# Patient Record
Sex: Female | Born: 1990 | Race: White | Hispanic: No | Smoking: Never smoker
Health system: Southern US, Community
[De-identification: ages and names within clinical notes are randomized; demographics above are authoritative.]

## PROBLEM LIST (undated history)

## (undated) DIAGNOSIS — B86 Scabies: Secondary | ICD-10-CM

## (undated) DIAGNOSIS — Z6841 Body Mass Index (BMI) 40.0 and over, adult: Secondary | ICD-10-CM

## (undated) DIAGNOSIS — B009 Herpesviral infection, unspecified: Secondary | ICD-10-CM

## (undated) DIAGNOSIS — F419 Anxiety disorder, unspecified: Secondary | ICD-10-CM

## (undated) DIAGNOSIS — O99210 Obesity complicating pregnancy, unspecified trimester: Secondary | ICD-10-CM

## (undated) DIAGNOSIS — K219 Gastro-esophageal reflux disease without esophagitis: Secondary | ICD-10-CM

## (undated) HISTORY — DX: Obesity complicating pregnancy, unspecified trimester: O99.210

## (undated) HISTORY — DX: Herpesviral infection, unspecified: B00.9

## (undated) HISTORY — DX: Anxiety disorder, unspecified: F41.9

## (undated) HISTORY — DX: Body Mass Index (BMI) 40.0 and over, adult: Z684

## (undated) HISTORY — PX: NO PAST SURGERIES: SHX2092

---
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
## 2014-08-05 ENCOUNTER — Encounter: Payer: Self-pay | Admitting: Emergency Medicine

## 2014-08-05 ENCOUNTER — Ambulatory Visit
Admission: EM | Admit: 2014-08-05 | Discharge: 2014-08-05 | Disposition: A | Discharge status: Home / Self Care | Payer: Self-pay | Attending: Internal Medicine | Admitting: Internal Medicine

## 2014-08-05 DIAGNOSIS — K529 Noninfective gastroenteritis and colitis, unspecified: Secondary | ICD-10-CM | POA: Insufficient documentation

## 2014-08-05 DIAGNOSIS — K219 Gastro-esophageal reflux disease without esophagitis: Secondary | ICD-10-CM | POA: Insufficient documentation

## 2014-08-05 HISTORY — DX: Gastro-esophageal reflux disease without esophagitis: K21.9

## 2014-08-05 LAB — URINALYSIS COMPLETE WITH MICROSCOPIC (ARMC ONLY)
Glucose, UA: NEGATIVE mg/dL
Hgb urine dipstick: NEGATIVE
LEUKOCYTES UA: NEGATIVE
Nitrite: NEGATIVE
PH: 5.5 (ref 5.0–8.0)
PROTEIN: NEGATIVE mg/dL
RBC / HPF: NONE SEEN RBC/hpf (ref <3)
Specific Gravity, Urine: 1.03 (ref 1.005–1.030)

## 2014-08-05 LAB — CBC WITH DIFFERENTIAL/PLATELET
BASOS ABS: 0.1 10*3/uL (ref 0–0.1)
BASOS PCT: 1 %
Eosinophils Absolute: 0.1 10*3/uL (ref 0–0.7)
Eosinophils Relative: 1 %
HEMATOCRIT: 40.7 % (ref 35.0–47.0)
Hemoglobin: 13.8 g/dL (ref 12.0–16.0)
Lymphocytes Relative: 15 %
Lymphs Abs: 1.5 10*3/uL (ref 1.0–3.6)
MCH: 29.9 pg (ref 26.0–34.0)
MCHC: 33.9 g/dL (ref 32.0–36.0)
MCV: 88.3 fL (ref 80.0–100.0)
MONO ABS: 0.6 10*3/uL (ref 0.2–0.9)
Monocytes Relative: 6 %
Neutro Abs: 7.8 10*3/uL — ABNORMAL HIGH (ref 1.4–6.5)
Neutrophils Relative %: 77 %
PLATELETS: 237 10*3/uL (ref 150–440)
RBC: 4.61 MIL/uL (ref 3.80–5.20)
RDW: 12.6 % (ref 11.5–14.5)
WBC: 10.1 10*3/uL (ref 3.6–11.0)

## 2014-08-05 LAB — COMPREHENSIVE METABOLIC PANEL
ALK PHOS: 75 U/L (ref 38–126)
ALT: 24 U/L (ref 14–54)
AST: 24 U/L (ref 15–41)
Albumin: 4.4 g/dL (ref 3.5–5.0)
Anion gap: 6 (ref 5–15)
BUN: 10 mg/dL (ref 6–20)
CHLORIDE: 103 mmol/L (ref 101–111)
CO2: 31 mmol/L (ref 22–32)
CREATININE: 0.91 mg/dL (ref 0.44–1.00)
Calcium: 10 mg/dL (ref 8.9–10.3)
GFR calc Af Amer: >60 mL/min (ref >60)
Glucose, Bld: 102 mg/dL — ABNORMAL HIGH (ref 65–99)
Potassium: 4 mmol/L (ref 3.5–5.1)
Sodium: 140 mmol/L (ref 135–145)
Total Bilirubin: 0.6 mg/dL (ref 0.3–1.2)
Total Protein: 7.9 g/dL (ref 6.5–8.1)

## 2014-08-05 LAB — PREGNANCY, URINE: PREG TEST UR: NEGATIVE

## 2014-08-05 LAB — RAPID STREP SCREEN (MED CTR MEBANE ONLY): Streptococcus, Group A Screen (Direct): NEGATIVE

## 2014-08-05 MED ORDER — ONDANSETRON 8 MG PO TBDP
8.0000 mg | ORAL_TABLET | Freq: Once | ORAL | Status: AC
  Administered 2014-08-05: 8 mg via ORAL

## 2014-08-05 MED ORDER — ESOMEPRAZOLE MAGNESIUM 40 MG PO CPDR: 40.0000 mg | DELAYED_RELEASE_CAPSULE | Freq: Every day | ORAL | Status: DC

## 2014-08-05 MED ORDER — ONDANSETRON HCL 8 MG PO TABS: 8.0000 mg | ORAL_TABLET | Freq: Two times a day (BID) | ORAL | Status: DC

## 2014-08-05 NOTE — ED Notes (Addendum)
Nausea and vomiting on and off for 2 months. Also c/o episodes of diarrhea with this. Worsening over the last three days. Keeping water down, but unable to keep pepto or tums down. Tastes like "acid" when she is vomiting. Is taking birth control pills. Takes it regularly. Is having chills. No fevers or body aches. Having left lower abdominal pain.

## 2014-08-05 NOTE — ED Provider Notes (Signed)
CSN: [id number]     Arrival date & time 08/05/14  0755 History   First MD Initiated Contact with Patient 08/05/14 [phone]     Chief Complaint  Patient presents with  . Nausea  . 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  Chills latest episode started 10 Jun and peptobismol tabs not working this time vomiting those up woke up 0500 with vomiting and belly pain this am.  4 bouts ememsis since then first visit to provider.  Problems started with diarrhea and vomiting 3 weeks ago was told at work others with similar symptoms thought she had virus.  Symptoms resolved but then constipation mucous stools second week.  Diarrhea has reoccurred 3-4 times but never vomiting this bad.  Pain currently 5-6/10 worst 9-10/10 3 weeks ago best 0/10 9 Jun.  Headache started 10 Jun dull.  Dizzy with emesis.  Feels like acid water coming up throat.  Noticed left eye rash has been apply cortisone cream and it has been improving used to be half dollar size now dime sized.  Patient is a 24 y.o. female presenting with vomiting. The history is provided by the patient.  Emesis Severity:  Moderate Duration:  4 days Timing:  Intermittent Number of daily episodes:  4 Quality:  Bilious material Able to tolerate:  Liquids Progression:  Worsening Chronicity:  Recurrent Recent urination:  Decreased Context: not post-tussive and not self-induced   Relieved by:  Nothing Worsened by:  Liquids Ineffective treatments:  Liquids Associated symptoms: abdominal pain, chills, diarrhea and headaches   Associated symptoms: no arthralgias, no cough, no fever, no myalgias, no sore throat and no URI   Abdominal pain:    Location:  LLQ and periumbilical   Quality:  Aching   Severity:  Moderate   Onset  quality:  Gradual   Duration:  3 weeks   Timing:  Intermittent   Progression:  Waxing and waning   Chronicity:  Recurrent Diarrhea:    Quality:  Mucous   Severity:  Moderate   Duration:  3 weeks   Timing:  Intermittent Headaches:    Severity:  Mild   Onset quality:  Sudden   Duration:  3 days   Timing:  Constant   Progression:  Waxing and waning   Chronicity:  New Risk factors: no alcohol use, no diabetes, not pregnant now, no prior abdominal surgery, no sick contacts, no suspect food intake and no travel to endemic areas     Past Medical History  Diagnosis Date  . GERD (gastroesophageal reflux disease)    History reviewed. No pertinent past surgical history. History reviewed. No pertinent family history. History  Substance Use Topics  . Smoking status: Never Smoker   . Smokeless tobacco: Never Used  . Alcohol Use: No   OB History    No data available     Review of Systems  Constitutional: Positive for chills. Negative for fever, diaphoresis, activity change, appetite change and fatigue.  HENT: Negative for congestion, ear discharge, ear pain, facial swelling, postnasal drip, rhinorrhea, sinus pressure, sneezing, sore throat, tinnitus, trouble swallowing and voice change.   Eyes: Negative for photophobia, pain, discharge, redness, itching and visual disturbance.  Respiratory: Negative for cough, choking, chest tightness, shortness of breath, wheezing and stridor.   Cardiovascular: Negative for chest pain, palpitations and leg swelling.  Gastrointestinal: Positive for nausea, vomiting, abdominal pain, diarrhea  and constipation. Negative for blood in stool, abdominal distention, anal bleeding and rectal pain.  Endocrine: Negative for cold intolerance and heat intolerance.  Genitourinary: Negative for dysuria, frequency, flank pain, enuresis and difficulty urinating.  Musculoskeletal: Negative for myalgias, back pain, joint swelling, arthralgias, gait problem, neck pain and  neck stiffness.  Skin: Negative for color change, pallor, rash and wound.  Allergic/Immunologic: Negative for environmental allergies and food allergies.  Neurological: Positive for dizziness and headaches. Negative for tremors, seizures, syncope, facial asymmetry, speech difficulty, weakness, light-headedness and numbness.  Hematological: Negative for adenopathy. Does not bruise/bleed easily.  Psychiatric/Behavioral: Positive for sleep disturbance. Negative for behavioral problems, confusion and agitation.    Allergies  Aluminum-containing compounds  Home Medications   Prior to Admission medications   Medication Sig Start Date End Date Taking? Authorizing Provider  norethindrone-ethinyl estradiol-iron (JUNEL FE 1.5/30) 1.5-30 MG-MCG tablet Take 1 tablet by mouth daily.   Yes Historical Provider, MD  ranitidine (ZANTAC) 150 MG tablet Take 150 mg by mouth daily.   Yes Historical Provider, MD  esomeprazole (NEXIUM) 40 MG capsule Take 1 capsule (40 mg total) by mouth daily. 08/05/14   Barbaraann Barthel, NP  ondansetron (ZOFRAN) 8 MG tablet Take 1 tablet (8 mg total) by mouth 2 (two) times daily. 08/05/14   Jarold Song Betancourt, NP   BP 122/75 mmHg  Pulse 68  Temp(Src) 98.1 F (36.7 C) (Oral)  Resp 16  Ht  (1.651 m)  Wt 208 lb (94.348 kg)  BMI 34.61 kg/m2  SpO2 98%  LMP 07/30/2014 (Exact Date) Physical Exam  Constitutional: She is oriented to person, place, and time. Vital signs are normal. She appears well-developed and well-nourished. No distress.  HENT:  Head: Normocephalic and atraumatic.  Right Ear: External ear normal.  Left Ear: External ear normal.  Nose: Nose normal.  Mouth/Throat: Oropharynx is clear and moist. No oropharyngeal exudate.  Air fluid level bilateral TMs clear; cobblestoning posterior pharynx; tonsils edema/erythema bilaterally 2+/4  Eyes: Conjunctivae, EOM and lids are normal. Pupils are equal, round, and reactive to light. Right eye exhibits no discharge.  Left eye exhibits no discharge. No scleral icterus.  Neck: Trachea normal and normal range of motion. Neck supple. No tracheal deviation present. No thyromegaly present.  Cardiovascular: Normal rate, regular rhythm, normal heart sounds and intact distal pulses.  Exam reveals no gallop and no friction rub.   No murmur heard. Pulmonary/Chest: Effort normal and breath sounds normal. No respiratory distress. She has no wheezes. She has no rales. She exhibits no tenderness.  Abdominal: Soft. Bowel sounds are normal. She exhibits no distension and no mass. There is tenderness. There is no rebound and no guarding.  Musculoskeletal: Normal range of motion. She exhibits no edema or tenderness.  Lymphadenopathy:    She has no cervical adenopathy.  Neurological: She is alert and oriented to person, place, and time. Coordination normal.  Skin: Skin is warm, dry and intact. Rash noted. She is not diaphoretic. No erythema. No pallor.     Psychiatric: She has a normal mood and affect. Her speech is normal and behavior is normal. Judgment and thought content normal. Cognition and memory are normal.  Nursing note and vitals reviewed.   ED Course  Procedures (including critical care time) Labs Review Labs Reviewed  URINALYSIS COMPLETEWITH MICROSCOPIC ([facility] ONLY) - Abnormal; Notable for the following:    APPearance HAZY (*)    Bilirubin Urine 1+ (*)    Ketones, ur TRACE (*)    Bacteria,  UA FEW (*)    Squamous Epithelial / LPF TOO NUMEROUS TO COUNT (*)    All other components within normal limits  COMPREHENSIVE METABOLIC PANEL - Abnormal; Notable for the following:    Glucose, Bld 102 (*)    All other components within normal limits  CBC WITH DIFFERENTIAL/PLATELET - Abnormal; Notable for the following:    Neutro Abs 7.8 (*)    All other components within normal limits  RAPID STREP SCREEN (NOT AT [facility])  CULTURE, GROUP A STREP ([facility] ONLY)  URINE CULTURE  PREGNANCY, URINE   Patient notified  urinalysis, HCG, CBC, CMP results.  Urine culture and throat culture pending over next 48 hours will contact her once results available.  Urinalysis did not appear to be UTI at this time. Nausea resolved with zofran and abdomen pain slightly improved.  Would have tried GI cocktail but patient allergic to maalox.   Patient verbalized understanding of information/instructions, agreed with plan of care and had no further questions at this time.  Imaging Review No results found.   MDM   1. Gastroenteritis    Patient given work excuse for 48 hours.  I have recommended clear fluids and the BRAT diet.  Medications as directed may repeat zofran 8mg  po in 8 hours then BID prn thereafter starting tomorrow. nexium 40mg  po daily x 2 weeks. Patient given supplies to collect stool culture if able to have BM at home.  Tried here but was unable.  Return to the clinic if  symptoms persist or worsen; I have alerted the patient to call if high fever, unable to urinate every 8 hours, dehydration, marked weakness, fainting, increased abdominal pain, blood in stool or vomit.  Patient verbalized agreement and understanding of treatment plan.   P2:  Hand washing and fitness    Barbaraann Barthel, NP 08/05/14 1150

## 2014-08-05 NOTE — Discharge Instructions (Signed)

## 2014-08-07 LAB — CULTURE, GROUP A STREP (THRC)

## 2014-08-08 LAB — URINE CULTURE
Culture: 1000
Special Requests: NORMAL

## 2014-10-26 ENCOUNTER — Ambulatory Visit
Admission: EM | Admit: 2014-10-26 | Discharge: 2014-10-26 | Disposition: A | Discharge status: Home / Self Care | Payer: Self-pay | Attending: Internal Medicine | Admitting: Internal Medicine

## 2014-10-26 DIAGNOSIS — B86 Scabies: Secondary | ICD-10-CM

## 2014-10-26 MED ORDER — PREDNISONE 20 MG PO TABS: 20.0000 mg | ORAL_TABLET | Freq: Every day | ORAL | Status: DC

## 2014-10-26 MED ORDER — IVERMECTIN 3 MG PO TABS: 200.0000 ug/kg | ORAL_TABLET | Freq: Once | ORAL | Status: DC

## 2014-10-26 NOTE — ED Provider Notes (Signed)
CSN: [id number]     Arrival date & time 10/26/14  1243 History   First MD Initiated Contact with Patient 10/26/14 1303     Chief Complaint  Patient presents with  . Allergic Reaction  . Rash   HPI Patient is a 24 year old lady since today with a two-week history of severe itching rash that started in the flexor surface of the right elbow, affects the right arm extensively, and is now appearing in the waistline, the groins, and on her back. The right distal forearm is excoriated and scabbed from scratching; the patient says her boyfriend wakes her up at night sometimes to stop her from scratching. No fever, rash is not sore. Patient is a Theatre stage manager.  Past Medical History  Diagnosis Date  . GERD (gastroesophageal reflux disease)    History reviewed. No pertinent past surgical history. History reviewed. No pertinent family history. Social History  Substance Use Topics  . Smoking status: Never Smoker   . Smokeless tobacco: Never Used  . Alcohol Use: No    Review of Systems  All other systems reviewed and are negative.   Allergies  Aluminum-containing compounds  Home Medications   Prior to Admission medications   Medication Sig Start Date End Date Taking? Authorizing Provider         norethindrone-ethinyl estradiol-iron (JUNEL FE 1.5/30) 1.5-30 MG-MCG tablet Take 1 tablet by mouth daily.    Historical Provider, MD          Meds Ordered and Administered this Visit    BP 120/68 mmHg  Pulse 86  Temp(Src) 98 F (36.7 C) (Oral)  Resp 20  Ht  (1.651 m)  Wt 207 lb (93.895 kg)  BMI 34.45 kg/m2  SpO2 98%  LMP 10/23/2014 No data found.   Physical Exam  Constitutional: She is oriented to person, place, and time. No distress.  Alert, nicely groomed  HENT:  Head: Atraumatic.  Eyes:  Conjugate gaze, no eye redness/drainage  Neck: Neck supple.  Cardiovascular: Normal rate.   Pulmonary/Chest: No respiratory distress.  Abdominal: She exhibits no distension.   Musculoskeletal: Normal range of motion.  No leg swelling  Neurological: She is alert and oriented to person, place, and time.  Skin: Skin is warm and dry.  Pink. No cyanosis Large pink/excoriated patches on the distal right forearm, and in the right elbow flexor surface. Similar, but less excoriated, patches along the waistline, and on the anterior abdomen in the mid back.  Nursing note and vitals reviewed.   ED Course  Procedures    MDM   1. Scabies    New Prescriptions   IVERMECTIN (STROMECTOL) 3 MG TABS TABLET    Take 4 tablets (12,000 mcg total) by mouth once.   PREDNISONE (DELTASONE) 20 MG TABLET    Take 1 tablet (20 mg total) by mouth daily. 3 tabs qd x 3d then 2 tabs qd x 3d then 1 tab qd x 3d then 0.5 tab qd x 4d then stop.   Anticipate gradual improvement in rash and itching over the next 2-4 weeks. Close household contacts should be treated as well. Environmental measures discussed.    Eustace Moore, MD 10/26/14 1329

## 2014-10-26 NOTE — Discharge Instructions (Signed)
Prescriptions for ivermectin (for mites) and prednisone (steroid, for itching) were sent to the [facility].  Anticipate gradual improvement in itching over the next 2-4 weeks. Recheck or followup primary care provider or dermatologist if not improving as expected.  Scabies Scabies are small bugs (mites) that burrow under the skin and cause red bumps and severe itching. These bugs can only be seen with a microscope. Scabies are highly contagious. They can spread easily from person to person by direct contact. They are also spread through sharing clothing or linens that have the scabies mites living in them. It is not unusual for an entire family to become infected through shared towels, clothing, or bedding.  HOME CARE INSTRUCTIONS   Your caregiver may prescribe a cream or lotion to kill the mites. If cream is prescribed, massage the cream into the entire body from the neck to the bottom of both feet. Also massage the cream into the scalp and face if your child is less than 49 year old. Avoid the eyes and mouth. Do not wash your hands after application.  Leave the cream on for 8 to 12 hours. Your child should bathe or shower after the 8 to 12 hour application period. Sometimes it is helpful to apply the cream to your child right before bedtime.  One treatment is usually effective and will eliminate approximately 95% of infestations. For severe cases, your caregiver may decide to repeat the treatment in 1 week. Everyone in your household should be treated with one application of the cream.  New rashes or burrows should not appear within 24 to 48 hours after successful treatment. However, the itching and rash may last for 2 to 4 weeks after successful treatment. Your caregiver may prescribe a medicine to help with the itching or to help the rash go away more quickly.  Scabies can live on clothing or linens for up to 3 days. All of your child's recently used clothing, towels, stuffed toys, and bed linens  should be washed in hot water and then dried in a dryer for at least 20 minutes on high heat. Items that cannot be washed should be enclosed in a plastic bag for at least 3 days.  To help relieve itching, bathe your child in a cool bath or apply cool washcloths to the affected areas.  Your child may return to school after treatment with the prescribed cream. SEEK MEDICAL CARE IF:   The itching persists longer than 4 weeks after treatment.  The rash spreads or becomes infected. Signs of infection include red blisters or yellow-tan crust. Document Released: 02/08/2005 Document Revised: 05/03/2011 Document Reviewed: 06/19/2008 [facility] Patient Information 2015 [place], [place]. This information is not intended to replace advice given to you by your health care provider. Make sure you discuss any questions you have with your health care provider.

## 2014-10-26 NOTE — ED Notes (Signed)
Pt with complaint of possible allergic reaction/rash that STARTED 2 WEEKS AGO. Itching a lot.

## 2014-11-19 ENCOUNTER — Ambulatory Visit
Admission: EM | Admit: 2014-11-19 | Discharge: 2014-11-19 | Disposition: A | Discharge status: Home / Self Care | Payer: Self-pay | Attending: Family Medicine | Admitting: Family Medicine

## 2014-11-19 DIAGNOSIS — B86 Scabies: Secondary | ICD-10-CM

## 2014-11-19 HISTORY — DX: Scabies: B86

## 2014-11-19 MED ORDER — PERMETHRIN 5 % EX CREA: TOPICAL_CREAM | CUTANEOUS | Status: DC

## 2014-11-19 MED ORDER — PREDNISONE 20 MG PO TABS
20.0000 mg | ORAL_TABLET | Freq: Every day | ORAL | Status: DC
Start: 2014-11-19 — End: 2019-05-20

## 2014-11-19 NOTE — ED Provider Notes (Signed)
CSN: [id number]     Arrival date & time 11/19/14  0827 History   First MD Initiated Contact with Patient 11/19/14 [phone]     Chief Complaint  Patient presents with  . Rash   (Consider location/radiation/quality/duration/timing/severity/associated sxs/prior Treatment) HPI Comments: 24 yo female with a h/o scabies, seen and treated 3 weeks ago for scabies, states her symptoms disappeared after treatment, but started recurring again 3 days ago. Similar rash and itching symptoms. States she washed all her clothes and bedding with hot water, but that her boyfriend is itching (without rash) and has not been seen or treated.  Patient is a 24 y.o. female presenting with rash. The history is provided by the patient.  Rash   Past Medical History  Diagnosis Date  . GERD (gastroesophageal reflux disease)   . Scabies    History reviewed. No pertinent past surgical history. History reviewed. No pertinent family history. Social History  Substance Use Topics  . Smoking status: Never Smoker   . Smokeless tobacco: Never Used  . Alcohol Use: No   OB History    No data available     Review of Systems  Skin: Positive for rash.    Allergies  Aluminum-containing compounds  Home Medications   Prior to Admission medications   Medication Sig Start Date End Date Taking? Authorizing Provider  cetirizine (ZYRTEC) 10 MG tablet Take 10 mg by mouth daily.   Yes Historical Provider, MD  norethindrone-ethinyl estradiol-iron (JUNEL FE 1.5/30) 1.5-30 MG-MCG tablet Take 1 tablet by mouth daily.   Yes Historical Provider, MD  ivermectin (STROMECTOL) 3 MG TABS tablet Take 4 tablets (12,000 mcg total) by mouth once. 10/26/14   Eustace Moore, MD  permethrin (ELIMITE) 5 % cream Apply to affected area once 11/19/14   Payton Mccallum, MD  predniSONE (DELTASONE) 20 MG tablet Take 1 tablet (20 mg total) by mouth daily. 11/19/14   Payton Mccallum, MD   Meds Ordered and Administered this Visit  Medications - No data to  display  BP 107/59 mmHg  Pulse 80  Temp(Src) 97.6 F (36.4 C) (Tympanic)  Resp 16  Ht  (1.651 m)  Wt 206 lb (93.441 kg)  BMI 34.28 kg/m2  SpO2 100%  LMP 10/23/2014 (Exact Date) No data found.   Physical Exam  Constitutional: She appears well-developed and well-nourished. No distress.  Pulmonary/Chest: Effort normal. No respiratory distress.  Skin: Rash noted. She is not diaphoretic.  Few erythematous papules, burrows and excoriations on flexural areas of forearms and few on lower back skin  Nursing note and vitals reviewed.   ED Course  Procedures (including critical care time)  Labs Review Labs Reviewed - No data to display  Imaging Review No results found.   Visual Acuity Review  Right Eye Distance:   Left Eye Distance:   Bilateral Distance:    Right Eye Near:   Left Eye Near:    Bilateral Near:         MDM   1. Scabies    New Prescriptions   PERMETHRIN (ELIMITE) 5 % CREAM    Apply to affected area once   PREDNISONE (DELTASONE) 20 MG TABLET    Take 1 tablet (20 mg total) by mouth daily.  Plan: 1. Test/x-ray results and diagnosis reviewed with patient 2. rx as per orders; risks, benefits, potential side effects reviewed with patient 3. Recommend supportive treatment with otc antihistamines prn for itching 4. F/u prn if symptoms worsen or don't improve  Payton Mccallum, MD 11/19/14 [phone]

## 2014-11-19 NOTE — ED Notes (Signed)
States seen here at [facility] the end of August and Dx with Scabies. Rash 'went away for 1 1/2 weeks' and now is back. Rash bilateral forearms, right hip, and on back. Itchy at night and when takes a hot shower

## 2015-01-08 ENCOUNTER — Ambulatory Visit
Admission: EM | Admit: 2015-01-08 | Discharge: 2015-01-08 | Disposition: A | Discharge status: Home / Self Care | Payer: Self-pay | Attending: Family Medicine | Admitting: Family Medicine

## 2015-01-08 ENCOUNTER — Encounter: Payer: Self-pay | Admitting: Emergency Medicine

## 2015-01-08 DIAGNOSIS — J069 Acute upper respiratory infection, unspecified: Secondary | ICD-10-CM

## 2015-01-08 MED ORDER — FLUTICASONE PROPIONATE 50 MCG/ACT NA SUSP: 2.0000 | Freq: Every day | NASAL | Status: DC

## 2015-01-08 MED ORDER — AZITHROMYCIN 250 MG PO TABS: ORAL_TABLET | ORAL | Status: DC

## 2015-01-08 MED ORDER — HYDROCOD POLST-CPM POLST ER 10-8 MG/5ML PO SUER: 5.0000 mL | Freq: Every evening | ORAL | Status: DC | PRN

## 2015-01-08 NOTE — ED Notes (Signed)
Patient states she has been congested since Sunday, last night ears and throat hurt and she noticed some bloody sinus drainage and coughed up some blood this morning.

## 2015-01-08 NOTE — ED Provider Notes (Signed)
Patient presents today with symptoms of nasal congestion with productive cough. Patient states that she's had the symptoms for the last 4-5 days. She also has had some bilateral ear pain with some streaky blood with mucus from her chest. She denies any chest pain or shortness breath. She denies any high fevers or night sweats. She has been taking over-the-counter cough medicine with little relief. She denies any history of COPD or asthma. She is a nonsmoker.  ROS: Negative except mentioned above. Vitals as per Epic.  GENERAL: NAD HEENT: no pharyngeal erythema, no exudate, no erythema of TMs, no cervical LAD RESP: CTA B CARD: RRR NEURO: CN II-XII grossly intact   A/P: URI- Patient given prescription for Z-Pak, Tussionex when necessary daily at bedtime, Flonase when necessary, rest, hydration, work excuse for today given, seek medical attention if symptoms persist or worsen as discussed.  Jolene ProvostKirtida Marea Reasner, MD 01/08/15 [phone]

## 2015-11-19 ENCOUNTER — Ambulatory Visit
Admission: EM | Admit: 2015-11-19 | Discharge: 2015-11-19 | Disposition: A | Discharge status: Home / Self Care | Payer: Self-pay | Attending: Family Medicine | Admitting: Family Medicine

## 2015-11-19 ENCOUNTER — Encounter: Payer: Self-pay | Admitting: *Deleted

## 2015-11-19 DIAGNOSIS — J029 Acute pharyngitis, unspecified: Secondary | ICD-10-CM

## 2015-11-19 LAB — CBC WITH DIFFERENTIAL/PLATELET
Basophils Absolute: 0.1 10*3/uL (ref 0–0.1)
Basophils Relative: 0 %
Eosinophils Absolute: 0.2 10*3/uL (ref 0–0.7)
Eosinophils Relative: 2 %
HEMATOCRIT: 39.6 % (ref 35.0–47.0)
HEMOGLOBIN: 13.4 g/dL (ref 12.0–16.0)
LYMPHS ABS: 1.9 10*3/uL (ref 1.0–3.6)
LYMPHS PCT: 15 %
MCH: 29.6 pg (ref 26.0–34.0)
MCHC: 33.8 g/dL (ref 32.0–36.0)
MCV: 87.5 fL (ref 80.0–100.0)
Monocytes Absolute: 1 10*3/uL — ABNORMAL HIGH (ref 0.2–0.9)
Monocytes Relative: 8 %
NEUTROS ABS: 9.5 10*3/uL — AB (ref 1.4–6.5)
NEUTROS PCT: 75 %
Platelets: 249 10*3/uL (ref 150–440)
RBC: 4.53 MIL/uL (ref 3.80–5.20)
RDW: 12.4 % (ref 11.5–14.5)
WBC: 12.7 10*3/uL — AB (ref 3.6–11.0)

## 2015-11-19 LAB — RAPID STREP SCREEN (MED CTR MEBANE ONLY): Streptococcus, Group A Screen (Direct): NEGATIVE

## 2015-11-19 LAB — MONONUCLEOSIS SCREEN: Mono Screen: NEGATIVE

## 2015-11-19 MED ORDER — AZITHROMYCIN 250 MG PO TABS: ORAL_TABLET | ORAL | 0 refills | Status: DC

## 2015-11-19 NOTE — ED Provider Notes (Addendum)
[facility]    CSN: [id number] Arrival date & time: 11/19/15  1653     History   Chief Complaint Chief Complaint  Patient presents with  . Sore Throat  . Headache    HPI TRENESE HAFT is a 25 y.o. female.   HPI: Patient presents today with symptoms of sore throat, cough, and headache. Patient states that last week she had symptoms of sore throat, nasal congestion, chills. She also has fatigue now. She denies any abdominal symptoms such as nausea, vomiting, diarrhea. She has no abdominal pain, chest pain, shortness of breath. She started her menstrual cycle today.  Past Medical History:  Diagnosis Date  . GERD (gastroesophageal reflux disease)   . Scabies     There are no active problems to display for this patient.   History reviewed. No pertinent surgical history.  OB History    No data available       Home Medications    Prior to Admission medications   Medication Sig Start Date End Date Taking? Authorizing Provider  cetirizine (ZYRTEC) 10 MG tablet Take 10 mg by mouth daily.   Yes Historical Provider, MD  norethindrone-ethinyl estradiol-iron (JUNEL FE 1.5/30) 1.5-30 MG-MCG tablet Take 1 tablet by mouth daily.   Yes Historical Provider, MD  azithromycin (ZITHROMAX Z-PAK) 250 MG tablet Use as directed for 5 days. 01/08/15   Jolene Provost, MD  chlorpheniramine-HYDROcodone (TUSSIONEX PENNKINETIC ER) 10-8 MG/5ML SUER Take 5 mLs by mouth at bedtime as needed for cough. 01/08/15   Jolene Provost, MD  fluticasone (FLONASE) 50 MCG/ACT nasal spray Place 2 sprays into both nostrils daily. As needed for nasal congestion 01/08/15   Jolene Provost, MD  ivermectin (STROMECTOL) 3 MG TABS tablet Take 4 tablets (12,000 mcg total) by mouth once. 10/26/14   Eustace Moore, MD  permethrin (ELIMITE) 5 % cream Apply to affected area once 11/19/14   Payton Mccallum, MD  predniSONE (DELTASONE) 20 MG tablet Take 1 tablet (20 mg total) by mouth daily. 11/19/14   Payton Mccallum, MD     Family History History reviewed. No pertinent family history.  Social History Social History  Substance Use Topics  . Smoking status: Never Smoker  . Smokeless tobacco: Never Used  . Alcohol use Yes     Allergies   Aluminum-containing compounds   Review of Systems Review of Systems: Negative except mentioned above.   Physical Exam Triage Vital Signs ED Triage Vitals  Enc Vitals Group     BP 11/19/15 1726 123/85     Pulse Rate 11/19/15 1726 84     Resp 11/19/15 1726 16     Temp 11/19/15 1726 99.3 F (37.4 C)     Temp Source 11/19/15 1726 Oral     SpO2 11/19/15 1726 98 %     Weight 11/19/15 1728 240 lb (108.9 kg)     Height 11/19/15 1728 5\' 5"  (1.651 m)     Head Circumference --      Peak Flow --      Pain Score --      Pain Loc --      Pain Edu? --      Excl. in GC? --    No data found.   Updated Vital Signs BP 123/85 (BP Location: Left Arm)   Pulse 84   Temp 99.3 F (37.4 C) (Oral)   Resp 16   Ht 5\' 5"  (1.651 m)   Wt 240 lb (108.9 kg)   LMP 11/19/2015 (Exact  Date)   SpO2 98%   BMI 39.94 kg/m      Physical Exam:  GENERAL: NAD HEENT: mild pharyngeal erythema, no exudate, enlarged tonsils, no erythema of TMs, mild cervical LAD RESP: CTA B CARD: RRR ABD: +BS, NT, no organomegly appreciated  NEURO: CN II-XII grosslly intact    UC Treatments / Results  Labs (all labs ordered are listed, but only abnormal results are displayed) Labs Reviewed  RAPID STREP SCREEN (NOT AT [facility])  CULTURE, GROUP A STREP ([facility])  CBC WITH DIFFERENTIAL/PLATELET  MONONUCLEOSIS SCREEN    EKG  EKG Interpretation None       Radiology No results found.  Procedures Procedures (including critical care time)  Medications Ordered in UC Medications - No data to display   Initial Impression / Assessment and Plan / UC Course  I have reviewed the triage vital signs and the nursing notes.  Pertinent labs & imaging results that were available during my care of  the patient were reviewed by me and considered in my medical decision making (see chart for details).  Clinical Course   A/P: Pharyngitis, URI - will treat with Z-Pak, Tylenol/Motrin when necessary, Delsym when necessary, oral antihistamine when necessary, rest, hydration, seek medical attention if symptoms persist or worsen.  Final Clinical Impressions(s) / UC Diagnoses   Final diagnoses:  None    New Prescriptions New Prescriptions   No medications on file     Jolene ProvostKirtida Kischa Altice, MD 11/19/15 1900    Jolene ProvostKirtida Janah Mcculloh, MD 11/19/15 1901

## 2015-11-19 NOTE — ED Triage Notes (Signed)
Pt had cold type symptoms last week but improved until yesterday. Awoke with sore throat yesterday and is worse today along with a headache and productive cough- yellow. States sputum was bloody this morning.

## 2015-11-22 LAB — CULTURE, GROUP A STREP (THRC)

## 2015-11-23 ENCOUNTER — Telehealth: Payer: Self-pay

## 2015-11-23 NOTE — Telephone Encounter (Signed)
Courtesy call back completed today after patient's visit at [facility]. Patient improved and will call back with any questions or concerns.  

## 2017-02-07 DIAGNOSIS — F411 Generalized anxiety disorder: Secondary | ICD-10-CM | POA: Insufficient documentation

## 2017-03-30 ENCOUNTER — Emergency Department: Payer: Self-pay

## 2017-03-30 ENCOUNTER — Emergency Department
Admission: EM | Admit: 2017-03-30 | Discharge: 2017-03-31 | Disposition: A | Discharge status: Home / Self Care | Payer: Self-pay | Attending: Emergency Medicine | Admitting: Emergency Medicine

## 2017-03-30 ENCOUNTER — Encounter: Payer: Self-pay | Admitting: Emergency Medicine

## 2017-03-30 DIAGNOSIS — R109 Unspecified abdominal pain: Secondary | ICD-10-CM

## 2017-03-30 DIAGNOSIS — R1031 Right lower quadrant pain: Secondary | ICD-10-CM | POA: Insufficient documentation

## 2017-03-30 DIAGNOSIS — R112 Nausea with vomiting, unspecified: Secondary | ICD-10-CM | POA: Insufficient documentation

## 2017-03-30 DIAGNOSIS — Z79899 Other long term (current) drug therapy: Secondary | ICD-10-CM | POA: Insufficient documentation

## 2017-03-30 LAB — URINALYSIS, COMPLETE (UACMP) WITH MICROSCOPIC
BACTERIA UA: NONE SEEN
Bilirubin Urine: NEGATIVE
GLUCOSE, UA: NEGATIVE mg/dL
HGB URINE DIPSTICK: NEGATIVE
KETONES UR: NEGATIVE mg/dL
Leukocytes, UA: NEGATIVE
NITRITE: NEGATIVE
PROTEIN: NEGATIVE mg/dL
Specific Gravity, Urine: 1.01 (ref 1.005–1.030)
pH: 5 (ref 5.0–8.0)

## 2017-03-30 LAB — COMPREHENSIVE METABOLIC PANEL
ALBUMIN: 4.2 g/dL (ref 3.5–5.0)
ALT: 39 U/L (ref 14–54)
ANION GAP: 9 (ref 5–15)
AST: 29 U/L (ref 15–41)
Alkaline Phosphatase: 80 U/L (ref 38–126)
BUN: 10 mg/dL (ref 6–20)
CO2: 25 mmol/L (ref 22–32)
Calcium: 9.3 mg/dL (ref 8.9–10.3)
Chloride: 105 mmol/L (ref 101–111)
Creatinine, Ser: 0.92 mg/dL (ref 0.44–1.00)
GFR calc Af Amer: >60 mL/min (ref >60)
GFR calc non Af Amer: >60 mL/min (ref >60)
GLUCOSE: 91 mg/dL (ref 65–99)
POTASSIUM: 4.1 mmol/L (ref 3.5–5.1)
SODIUM: 139 mmol/L (ref 135–145)
Total Bilirubin: 0.4 mg/dL (ref 0.3–1.2)
Total Protein: 7.9 g/dL (ref 6.5–8.1)

## 2017-03-30 LAB — CBC
HEMATOCRIT: 39.8 % (ref 35.0–47.0)
HEMOGLOBIN: 13.5 g/dL (ref 12.0–16.0)
MCH: 30.4 pg (ref 26.0–34.0)
MCHC: 33.9 g/dL (ref 32.0–36.0)
MCV: 89.6 fL (ref 80.0–100.0)
Platelets: 278 10*3/uL (ref 150–440)
RBC: 4.44 MIL/uL (ref 3.80–5.20)
RDW: 13.6 % (ref 11.5–14.5)
WBC: 10.7 10*3/uL (ref 3.6–11.0)

## 2017-03-30 LAB — POCT PREGNANCY, URINE: Preg Test, Ur: NEGATIVE

## 2017-03-30 LAB — LIPASE, BLOOD: LIPASE: 31 U/L (ref 11–51)

## 2017-03-30 MED ORDER — IOPAMIDOL (ISOVUE-300) INJECTION 61%
100.0000 mL | Freq: Once | INTRAVENOUS | Status: AC | PRN
  Administered 2017-03-30: 100 mL via INTRAVENOUS

## 2017-03-30 MED ORDER — ONDANSETRON HCL 4 MG/2ML IJ SOLN
4.0000 mg | Freq: Once | INTRAMUSCULAR | Status: AC
  Administered 2017-03-31: 4 mg via INTRAVENOUS
  Filled 2017-03-30: qty 2

## 2017-03-30 MED ORDER — MORPHINE SULFATE (PF) 2 MG/ML IV SOLN
2.0000 mg | Freq: Once | INTRAVENOUS | Status: AC
  Administered 2017-03-31: 2 mg via INTRAVENOUS
  Filled 2017-03-30: qty 1

## 2017-03-30 NOTE — ED Triage Notes (Signed)
Pt comes into the ED via POV c/o left sided abdominal pain that initially started on the RUQ and has now moved.  [facility] US but the patient left before receiving results.  Patient has N/V but denies diarrhea.  Patient has abdominal tenderness upon palpation on the LLQ.  Patient ambulatory to triage at this time and in NAD with even and unlabored respirations.

## 2017-03-30 NOTE — ED Provider Notes (Addendum)
[facility] Emergency Department Provider Note    First MD Initiated Contact with Patient 03/30/17 2320     (approximate)  I have reviewed the triage vital signs and the nursing notes.   HISTORY  Chief Complaint Abdominal Pain    HPI Kelly Baxter is a 27 y.o. female resents to the emergency department with 8 out of 10 generalized abdominal pain involving right lower quadrant left lower quadrant left upper quadrant times 2 days associated with nausea and vomiting.  Patient denies any diarrhea.  Patient admits to a low-grade fever yesterday that was subjective.  Patient denies any dysuria or hematuria.  Patient denies any vaginal discharge.   Past Medical History:  Diagnosis Date  . GERD (gastroesophageal reflux disease)   . Scabies     There are no active problems to display for this patient.   History reviewed. No pertinent surgical history.  Prior to Admission medications   Medication Sig Start Date End Date Taking? Authorizing Provider  fluticasone (FLONASE) 50 MCG/ACT nasal spray Place 2 sprays into both nostrils daily. As needed for nasal congestion 01/08/15  Yes Jolene ProvostPatel, Kirtida, MD  norethindrone-ethinyl estradiol-iron (JUNEL FE 1.5/30) 1.5-30 MG-MCG tablet Take 1 tablet by mouth daily.   Yes [provider]  ondansetron (ZOFRAN) 4 MG tablet Take 4 mg by mouth every 8 (eight) hours as needed for nausea or vomiting.   Yes [provider]  azithromycin (ZITHROMAX Z-PAK) 250 MG tablet Use as directed for 5 days as on box. Patient not taking: Reported on 03/30/2017 11/19/15   Jolene ProvostPatel, Kirtida, MD  cetirizine (ZYRTEC) 10 MG tablet Take 10 mg by mouth daily.    [provider]  chlorpheniramine-HYDROcodone (TUSSIONEX PENNKINETIC ER) 10-8 MG/5ML SUER Take 5 mLs by mouth at bedtime as needed for cough. Patient not taking: Reported on 03/30/2017 01/08/15   Jolene ProvostPatel, Kirtida, MD  dicyclomine (BENTYL) 20 MG tablet Take 1 tablet (20 mg  total) by mouth 3 (three) times daily as needed for spasms. 03/31/17 03/31/18  Darci CurrentBrown, Milburn N, MD  ivermectin (STROMECTOL) 3 MG TABS tablet Take 4 tablets (12,000 mcg total) by mouth once. 10/26/14   Isa RankinMurray, Laura Wilson, MD  ondansetron (ZOFRAN ODT) 4 MG disintegrating tablet Take 1 tablet (4 mg total) by mouth every 8 (eight) hours as needed for nausea or vomiting. 03/31/17   Darci CurrentBrown, [place] N, MD  permethrin (ELIMITE) 5 % cream Apply to affected area once Patient not taking: Reported on 03/30/2017 11/19/14   Payton Mccallumonty, [place], MD  predniSONE (DELTASONE) 20 MG tablet Take 1 tablet (20 mg total) by mouth daily. Patient not taking: Reported on 03/30/2017 11/19/14   Payton Mccallumonty, [place], MD  traMADol (ULTRAM) 50 MG tablet Take 1 tablet (50 mg total) by mouth every 6 (six) hours as needed. 03/31/17 03/31/18  Darci CurrentBrown, [place] N, MD  esomeprazole (NEXIUM) 40 MG capsule Take 1 capsule (40 mg total) by mouth daily. 08/05/14 10/26/14  Betancourt, Jarold Songina A, NP  ranitidine (ZANTAC) 150 MG tablet Take 150 mg by mouth daily.  10/26/14  [provider]    Allergies Aluminum-containing compounds  No family history on file.  Social History Social History   Tobacco Use  . Smoking status: Never Smoker  . Smokeless tobacco: Never Used  Substance Use Topics  . Alcohol use: Yes  . Drug use: No    Review of Systems Constitutional: No fever/chills Eyes: No visual changes. ENT: No sore throat. Cardiovascular: Denies chest pain. Respiratory: Denies shortness of breath. Gastrointestinal:  Positive for abdominal pain vomiting no diarrhea.  No constipation. Genitourinary: Negative for dysuria. Musculoskeletal: Negative for neck pain.  Negative for back pain. Integumentary: Negative for rash. Neurological: Negative for headaches, focal weakness or numbness.   ____________________________________________   PHYSICAL EXAM:  VITAL SIGNS: ED Triage Vitals [03/30/17 1920]  Enc Vitals Group     BP 123/80     Pulse Rate 78       Resp 18     Temp 98.2 F (36.8 C)     Temp Source Oral     SpO2 100 %     Weight 113.4 kg (250 lb)     Height 1.651 m (5\' 5" )     Head Circumference      Peak Flow      Pain Score 10     Pain Loc      Pain Edu?      Excl. in GC?     Constitutional: Alert and oriented. Well appearing and in no acute distress. Eyes: Conjunctivae are normal. Head: Atraumatic. Mouth/Throat: Mucous membranes are moist. Oropharynx non-erythematous. Neck: No stridor.  No meningeal signs.  Cardiovascular: Normal rate, regular rhythm. Good peripheral circulation. Grossly normal heart sounds. Respiratory: Normal respiratory effort.  No retractions. Lungs CTAB. Gastrointestinal: Right  lower quadrant/left lower quadrant/left lower quadrant no distention.  Musculoskeletal: No lower extremity tenderness nor edema. No gross deformities of extremities. Neurologic:  Normal speech and language. No gross focal neurologic deficits are appreciated.  Skin:  Skin is warm, dry and intact. No rash noted. Psychiatric: Mood and affect are normal. Speech and behavior are normal.  ____________________________________________   LABS (all labs ordered are listed, but only abnormal results are displayed)  Labs Reviewed  URINALYSIS, COMPLETE (UACMP) WITH MICROSCOPIC - Abnormal; Notable for the following components:      Result Value   Color, Urine YELLOW (*)    APPearance HAZY (*)    Squamous Epithelial / LPF 0-5 (*)    All other components within normal limits  LIPASE, BLOOD  COMPREHENSIVE METABOLIC PANEL  CBC  POC URINE PREG, ED  POCT PREGNANCY, URINE   _______________________________  RADIOLOGY I, [place] Floriene Jeschke, personally viewed and evaluated these images (plain radiographs) as part of my medical decision making, as well as reviewing the written report by the radiologist.    Official radiology report(s): Ct Abdomen Pelvis W Contrast  Result Date: 03/30/2017 CLINICAL DATA:  Acute onset of left-sided  abdominal pain. Nausea and vomiting. EXAM: CT ABDOMEN AND PELVIS WITH CONTRAST TECHNIQUE: Multidetector CT imaging of the abdomen and pelvis was performed using the standard protocol following bolus administration of intravenous contrast. CONTRAST:  ISOVUE-300 IOPAMIDOL (ISOVUE-300) INJECTION 61% COMPARISON:  None. FINDINGS: Lower chest: Minimal right basilar atelectasis is noted. The visualized portions of the mediastinum are unremarkable. Hepatobiliary: The liver is unremarkable in appearance. The gallbladder is unremarkable in appearance. The common bile duct remains normal in caliber. Pancreas: The pancreas is within normal limits. Spleen: The spleen is unremarkable in appearance. Adrenals/Urinary Tract: The adrenal glands are unremarkable in appearance. The kidneys are within normal limits. There is no evidence of hydronephrosis. No renal or ureteral stones are identified. No perinephric stranding is seen. Stomach/Bowel: The stomach is unremarkable in appearance. The small bowel is within normal limits. The appendix is normal in caliber, without evidence of appendicitis. The colon is unremarkable in appearance. Vascular/Lymphatic: The abdominal aorta is unremarkable in appearance. The inferior vena cava is grossly unremarkable. No retroperitoneal lymphadenopathy is seen. No  pelvic sidewall lymphadenopathy is identified. Reproductive: The bladder is mildly distended and grossly unremarkable. The uterus is unremarkable in appearance. The ovaries are relatively symmetric. No suspicious adnexal masses are seen. Other: No additional soft tissue abnormalities are seen. Musculoskeletal: No acute osseous abnormalities are identified. The visualized musculature is unremarkable in appearance. IMPRESSION: No acute abnormality seen within the abdomen or pelvis. Electronically Signed   By: Roanna Raider M.D.   On: 03/30/2017 23:59     Procedures   ____________________________________________   INITIAL  IMPRESSION / ASSESSMENT AND PLAN / ED COURSE  As part of my medical decision making, I reviewed the following data within the electronic MEDICAL RECORD NUMBER84 year old female present with above-stated history of generalized abdominal discomfort with no clear etiology identified.  Consider the possibility of nephrolithiasis ureterolithiasis colitis diverticulitis or appendicitis however CT scan of the abdomen revealed no laboratory data unremarkable including urinalysis did patient's mother insists significant other states that symptoms have been occurring for the past 3 weeks.  Patient's mother also adds the fact that she is being evaluated for colitis.  Consider to possibly of inflammatory bowel disease.  Patient will be referred to gastroenterology for further outpatient evaluation and management patient's abdominal pain was DIScovered today ____________________________________________  FINAL CLINICAL IMPRESSION(S) / ED DIAGNOSES  Final diagnoses:  Abdominal pain, unspecified abdominal location     MEDICATIONS GIVEN DURING THIS VISIT:  Medications  morphine 2 MG/ML injection 2 mg (2 mg Intravenous Given 03/31/17 0013)  ondansetron (ZOFRAN) injection 4 mg (4 mg Intravenous Given 03/31/17 0013)  iopamidol (ISOVUE-300) 61 % injection 100 mL (100 mLs Intravenous Contrast Given 03/30/17 2344)     ED Discharge Orders        Ordered    dicyclomine (BENTYL) 20 MG tablet  3 times daily PRN     03/31/17 0202    ondansetron (ZOFRAN ODT) 4 MG disintegrating tablet  Every 8 hours PRN     03/31/17 0202    traMADol (ULTRAM) 50 MG tablet  Every 6 hours PRN     03/31/17 0202       Note:  This document was prepared using Dragon voice recognition software and may include unintentional dictation errors.    Darci Current, MD 03/31/17 0205    Darci Current, MD 03/31/17 [phone]

## 2017-03-30 NOTE — ED Notes (Signed)
PT states that she has had abdominal pain for past 3 weeks. States she would vomit bile so she went to urgent care and they just gave her something for nausea. Pain in on left side/ flank area and across lower abdominal. Family at bedside.

## 2017-03-31 MED ORDER — ONDANSETRON 4 MG PO TBDP: 4.0000 mg | ORAL_TABLET | Freq: Three times a day (TID) | ORAL | 0 refills | Status: DC | PRN

## 2017-03-31 MED ORDER — DICYCLOMINE HCL 20 MG PO TABS: 20.0000 mg | ORAL_TABLET | Freq: Three times a day (TID) | ORAL | 0 refills | Status: DC | PRN

## 2017-03-31 MED ORDER — TRAMADOL HCL 50 MG PO TABS: 50.0000 mg | ORAL_TABLET | Freq: Four times a day (QID) | ORAL | 0 refills | Status: AC | PRN

## 2018-12-10 LAB — FETAL NONSTRESS TEST

## 2019-05-08 ENCOUNTER — Telehealth: Payer: Self-pay

## 2019-05-08 NOTE — Telephone Encounter (Signed)
Pt calling triage today needing to know a safe stool softener to take during pregnancy. Tried to call pt but went straight to VM. Lm with pt to try to use miralax first

## 2019-05-11 ENCOUNTER — Emergency Department
Admission: EM | Admit: 2019-05-11 | Discharge: 2019-05-11 | Disposition: A | Discharge status: Home / Self Care | Payer: Medicaid Other | Attending: Emergency Medicine | Admitting: Emergency Medicine

## 2019-05-11 ENCOUNTER — Other Ambulatory Visit: Payer: Self-pay

## 2019-05-11 DIAGNOSIS — K5900 Constipation, unspecified: Secondary | ICD-10-CM | POA: Diagnosis not present

## 2019-05-11 DIAGNOSIS — E860 Dehydration: Secondary | ICD-10-CM

## 2019-05-11 LAB — CBC
HCT: 39.9 % (ref 36.0–46.0)
Hemoglobin: 13.5 g/dL (ref 12.0–15.0)
MCH: 30.8 pg (ref 26.0–34.0)
MCHC: 33.8 g/dL (ref 30.0–36.0)
MCV: 90.9 fL (ref 80.0–100.0)
Platelets: 279 10*3/uL (ref 150–400)
RBC: 4.39 MIL/uL (ref 3.87–5.11)
RDW: 12.6 % (ref 11.5–15.5)
WBC: 15.1 10*3/uL — ABNORMAL HIGH (ref 4.0–10.5)
nRBC: 0 % (ref 0.0–0.2)

## 2019-05-11 LAB — COMPREHENSIVE METABOLIC PANEL
ALT: 16 U/L (ref 0–44)
AST: 15 U/L (ref 15–41)
Albumin: 4.6 g/dL (ref 3.5–5.0)
Alkaline Phosphatase: 79 U/L (ref 38–126)
Anion gap: 9 (ref 5–15)
BUN: 7 mg/dL (ref 6–20)
CO2: 25 mmol/L (ref 22–32)
Calcium: 9.6 mg/dL (ref 8.9–10.3)
Chloride: 103 mmol/L (ref 98–111)
Creatinine, Ser: 0.68 mg/dL (ref 0.44–1.00)
GFR calc Af Amer: >60 mL/min (ref >60)
GFR calc non Af Amer: >60 mL/min (ref >60)
Glucose, Bld: 103 mg/dL — ABNORMAL HIGH (ref 70–99)
Potassium: 3.7 mmol/L (ref 3.5–5.1)
Sodium: 137 mmol/L (ref 135–145)
Total Bilirubin: 0.6 mg/dL (ref 0.3–1.2)
Total Protein: 7.7 g/dL (ref 6.5–8.1)

## 2019-05-11 LAB — URINALYSIS, COMPLETE (UACMP) WITH MICROSCOPIC
Bacteria, UA: NONE SEEN
Bilirubin Urine: NEGATIVE
Glucose, UA: NEGATIVE mg/dL
Hgb urine dipstick: NEGATIVE
Ketones, ur: 20 mg/dL — AB
Leukocytes,Ua: NEGATIVE
Nitrite: NEGATIVE
Protein, ur: 30 mg/dL — AB
Specific Gravity, Urine: 1.021 (ref 1.005–1.030)
pH: 5 (ref 5.0–8.0)

## 2019-05-11 LAB — HCG, QUANTITATIVE, PREGNANCY: hCG, Beta Chain, Quant, S: 93841 m[IU]/mL — ABNORMAL HIGH (ref <5)

## 2019-05-11 LAB — LIPASE, BLOOD: Lipase: 23 U/L (ref 11–51)

## 2019-05-11 LAB — POCT PREGNANCY, URINE: Preg Test, Ur: POSITIVE — AB

## 2019-05-11 MED ORDER — SODIUM CHLORIDE 0.9 % IV SOLN: Freq: Once | INTRAVENOUS | Status: AC

## 2019-05-11 MED ORDER — POLYETHYLENE GLYCOL 3350 17 G PO PACK: 17.0000 g | PACK | Freq: Two times a day (BID) | ORAL | 0 refills | Status: DC

## 2019-05-11 MED ORDER — SODIUM CHLORIDE 0.9% FLUSH: 3.0000 mL | Freq: Once | INTRAVENOUS | Status: DC

## 2019-05-11 MED ORDER — ONDANSETRON 4 MG PO TBDP: 4.0000 mg | ORAL_TABLET | Freq: Three times a day (TID) | ORAL | 1 refills | Status: DC | PRN

## 2019-05-11 NOTE — Telephone Encounter (Signed)
Pt states she cannot keep anything down, no fluids or food. So probably really dehydrated and shes very constipated. I advised her to go to L&D for fluids and explain to them waht is going on. Pt going today

## 2019-05-11 NOTE — ED Provider Notes (Signed)
[facility] Emergency Department Provider Note       Time seen: ----------------------------------------- 6:06 PM on 05/11/2019 -----------------------------------------   I have reviewed the triage vital signs and the nursing notes.  HISTORY   Chief Complaint dehydration   HPI Kelly Baxter is a 29 y.o. female with a history of GERD, scabies who presents to the ED for possible dehydration and a fall last night.  Patient states she is [redacted] weeks pregnant and spoke with her OB doctor who recommended that she come and get checked out.  She does complain of being constipated for the past several weeks, has had some vomiting and difficulty keeping fluids down.  Past Medical History:  Diagnosis Date  . GERD (gastroesophageal reflux disease)   . Scabies     There are no problems to display for this patient.   History reviewed. No pertinent surgical history.  Allergies Aluminum-containing compounds  Social History Social History   Tobacco Use  . Smoking status: Never Smoker  . Smokeless tobacco: Never Used  Substance Use Topics  . Alcohol use: Yes  . Drug use: No    Review of Systems Constitutional: Negative for fever. Cardiovascular: Negative for chest pain. Respiratory: Negative for shortness of breath. Gastrointestinal: Positive for vomiting Musculoskeletal: Negative for back pain. Skin: Negative for rash. Neurological: Negative for headaches, focal weakness or numbness.  All systems negative/normal/unremarkable except as stated in the HPI  ____________________________________________   PHYSICAL EXAM:  VITAL SIGNS: ED Triage Vitals  Enc Vitals Group     BP 05/11/19 1719 116/64     Pulse Rate 05/11/19 1719 66     Resp 05/11/19 1719 18     Temp 05/11/19 1719 98.3 F (36.8 C)     Temp src --      SpO2 05/11/19 1719 100 %     Weight 05/11/19 1720 240 lb (108.9 kg)     Height 05/11/19 1720 5\' 4"  (1.626 m)     Head Circumference  --      Peak Flow --      Pain Score 05/11/19 1720 0     Pain Loc --      Pain Edu? --      Excl. in GC? --     Constitutional: Alert and oriented. Well appearing and in no distress. Eyes: Conjunctivae are normal. Normal extraocular movements. Cardiovascular: Normal rate, regular rhythm. No murmurs, rubs, or gallops. Respiratory: Normal respiratory effort without tachypnea nor retractions. Breath sounds are clear and equal bilaterally. No wheezes/rales/rhonchi. Gastrointestinal: Soft and nontender. Normal bowel sounds Musculoskeletal: Nontender with normal range of motion in extremities. No lower extremity tenderness nor edema. Neurologic:  Normal speech and language. No gross focal neurologic deficits are appreciated.  Skin:  Skin is warm, dry and intact. No rash noted. Psychiatric: Mood and affect are normal. Speech and behavior are normal.  ____________________________________________  ED COURSE:  As part of my medical decision making, I reviewed the following data within the electronic MEDICAL RECORD NUMBER History obtained from family if available, nursing notes, old chart and ekg, as well as notes from prior ED visits. Patient presented for possible dehydration and pregnancy, we will assess with labs and imaging as indicated at this time.   Procedures  SHEIKA COUTTS was evaluated in Emergency Department on 05/11/2019 for the symptoms described in the history of present illness. She was evaluated in the context of the global COVID-19 pandemic, which necessitated consideration that the patient might be at risk  for infection with the SARS-CoV-2 virus that causes COVID-19. Institutional protocols and algorithms that pertain to the evaluation of patients at risk for COVID-19 are in a state of rapid change based on information released by regulatory bodies including the CDC and federal and state organizations. These policies and algorithms were followed during the patient's care in the ED.   ____________________________________________   LABS (pertinent positives/negatives)  Labs Reviewed  COMPREHENSIVE METABOLIC PANEL - Abnormal; Notable for the following components:      Result Value   Glucose, Bld 103 (*)    All other components within normal limits  CBC - Abnormal; Notable for the following components:   WBC 15.1 (*)    All other components within normal limits  URINALYSIS, COMPLETE (UACMP) WITH MICROSCOPIC - Abnormal; Notable for the following components:   Color, Urine YELLOW (*)    APPearance HAZY (*)    Ketones, ur 20 (*)    Protein, ur 30 (*)    All other components within normal limits  HCG, QUANTITATIVE, PREGNANCY - Abnormal; Notable for the following components:   hCG, Beta Chain, Quant, S 93,841 (*)    All other components within normal limits  POCT PREGNANCY, URINE - Abnormal; Notable for the following components:   Preg Test, Ur POSITIVE (*)    All other components within normal limits  LIPASE, BLOOD  ___________________________________________   DIFFERENTIAL DIAGNOSIS   Dehydration, physiologic changes of pregnancy, electrolyte abnormality, occult infection  FINAL ASSESSMENT AND PLAN  Dehydration, constipation, first trimester pregnancy   Plan: The patient had presented for possible dehydration. Patient's labs were grossly unremarkable.  Patient will be given MiraLAX as well as Zofran to take as needed.  She is cleared for outpatient follow-up.   Laurence Aly, MD    Note: This note was generated in part or whole with voice recognition software. Voice recognition is usually quite accurate but there are transcription errors that can and very often do occur. I apologize for any typographical errors that were not detected and corrected.     Earleen Newport, MD 05/11/19 1929

## 2019-05-11 NOTE — ED Triage Notes (Signed)
Pt comes iva POV from home with c/o dehydration and fall. Pt states she is [redacted] weeks pregnant and spoke with OBGYN  Today who recommended her to come and get seen.  Pt states she has been constipation. Pt states some vomiting and unable to hold down liquids.

## 2019-05-18 ENCOUNTER — Other Ambulatory Visit: Payer: Self-pay

## 2019-05-18 ENCOUNTER — Encounter: Payer: Self-pay | Admitting: Certified Nurse Midwife

## 2019-05-18 ENCOUNTER — Other Ambulatory Visit (HOSPITAL_COMMUNITY)
Admission: RE | Admit: 2019-05-18 | Discharge: 2019-05-18 | Disposition: A | Discharge status: Home / Self Care | Payer: Medicaid Other | Source: Ambulatory Visit | Attending: Certified Nurse Midwife | Admitting: Certified Nurse Midwife

## 2019-05-18 ENCOUNTER — Ambulatory Visit (INDEPENDENT_AMBULATORY_CARE_PROVIDER_SITE_OTHER): Payer: Medicaid Other | Admitting: Certified Nurse Midwife

## 2019-05-18 VITALS — BP 110/60 | Ht 64.0 in | Wt 239.0 lb

## 2019-05-18 DIAGNOSIS — Z6841 Body Mass Index (BMI) 40.0 and over, adult: Secondary | ICD-10-CM

## 2019-05-18 DIAGNOSIS — O99210 Obesity complicating pregnancy, unspecified trimester: Secondary | ICD-10-CM

## 2019-05-18 DIAGNOSIS — O0990 Supervision of high risk pregnancy, unspecified, unspecified trimester: Secondary | ICD-10-CM

## 2019-05-18 DIAGNOSIS — O09891 Supervision of other high risk pregnancies, first trimester: Secondary | ICD-10-CM

## 2019-05-18 DIAGNOSIS — O98311 Other infections with a predominantly sexual mode of transmission complicating pregnancy, first trimester: Secondary | ICD-10-CM | POA: Diagnosis not present

## 2019-05-18 DIAGNOSIS — Z113 Encounter for screening for infections with a predominantly sexual mode of transmission: Secondary | ICD-10-CM

## 2019-05-18 DIAGNOSIS — Z124 Encounter for screening for malignant neoplasm of cervix: Secondary | ICD-10-CM

## 2019-05-18 DIAGNOSIS — A6000 Herpesviral infection of urogenital system, unspecified: Secondary | ICD-10-CM | POA: Diagnosis not present

## 2019-05-18 DIAGNOSIS — Z3A08 8 weeks gestation of pregnancy: Secondary | ICD-10-CM

## 2019-05-18 LAB — POCT URINALYSIS DIPSTICK OB
Glucose, UA: NEGATIVE
POC,PROTEIN,UA: NEGATIVE

## 2019-05-18 LAB — OB RESULTS CONSOLE VARICELLA ZOSTER ANTIBODY, IGG: Varicella: IMMUNE

## 2019-05-18 LAB — OB RESULTS CONSOLE GC/CHLAMYDIA: Gonorrhea: NEGATIVE

## 2019-05-18 NOTE — Progress Notes (Signed)
C/O constipation.rj

## 2019-05-18 NOTE — Progress Notes (Signed)
New Obstetric Patient H&P    Chief Complaint: "Desires prenatal care"   History of Present Illness: Kelly Baxter is a 29 y.o. G1P0 Not Hispanic or Latino female, LMP 03/19/2019 presents with amenorrhea and positive home pregnancy test. Based on her  LMP, her EDD is 12/24/19 and her EGA is [redacted]w[redacted]d. Cycles are 5-6. days, regular, and occur approximately every : 30 days. Her last pap smear was 1 or 2 years ago and was negative.    She had a urine pregnancy test which was positive 04/26/2019 . Her last menstrual period was heavier and shorter and lasted for  3 day(s). Since her LMP she claims she has experienced nausea, constipation, fatigue, headache, anxiety and allergies. She denies vaginal bleeding. Her past medical history is remarkable for genital herpes (has 1-2 outbreaks a year), obesity, chronic constipation, GERD, and anxiety.  Since her LMP, she admits to the use of tobacco products  No Since her LMP she denies use of alcohol: yes Since her LMP, she denies use of illicit street drugs: yes She claims she has lost  16 pounds since the start of her pregnancy.  There are cats in the home in the home  no   She admits close contact with children on a regular basis  no  She has had chicken pox in the past yes She has had Tuberculosis exposures, symptoms, or previously tested positive for TB   no Current or past history of domestic violence. no  Genetic Screening/Teratology Counseling: (Includes patient, baby's father, or anyone in either family with:)   1. Patient's age >/= 16 at [facility]  no 2. Thalassemia ([place], [place], Mediterranean, or Asian background): MCV<80  no 3. Neural tube defect (meningomyelocele, spina bifida, anencephaly)  no 4. Congenital heart defect  no  5. Down syndrome  no 6. Tay-Sachs (Jewish, [place]  no 7. Canavan's Disease  no 8. Sickle cell disease or trait (African)  no  9. Hemophilia or other blood disorders  no  10. Muscular dystrophy  no  11.  Cystic fibrosis  no  12. Huntington's Chorea  no  13. Mental retardation/autism  no 14. Other inherited genetic or chromosomal disorder  no 15. Maternal metabolic disorder (DM, PKU, etc)  no 16. Patient or FOB with a child with a birth defect not listed above no  16a. Patient or FOB with a birth defect themselves no 17. Recurrent pregnancy loss, or stillbirth  no  18. Any medications since LMP other than prenatal vitamins (include vitamins, supplements, OTC meds, drugs, alcohol)  Yes, Miralax, Zofran, pantoprazole 40 mgm and Colace. Uses Flonase and acyclovir prn. 19. Any other genetic/environmental exposure to discuss  no  Infection History:   1. Lives with someone with TB or TB exposed  no  2. Patient or partner has history of genital herpes  Yes, patient has a hx of genital herpes. 3. Rash or viral illness since LMP  no 4. History of STI (GC, CT, HPV, syphilis, HIV)  Yes, see #2 5. History of recent travel :  no  Other pertinent information:  Yes, father of baby is Ladene Artist, age 80. Patient works as a Diplomatic Services operational officer for a [employer] and her partner is a Engineer, site.    Review of Systems:10 point review of systems negative unless otherwise noted in HPI  Past Medical History:  Past Medical History:  Diagnosis Date  . Adult BMI 40.0-44.9 kg/sq m (HCC)   . Anxiety   .  GERD (gastroesophageal reflux disease)   . Herpes   . Obesity affecting pregnancy     Past Surgical History:  Past Surgical History:  Procedure Laterality Date  . NO PAST SURGERIES      Gynecologic History: Patient's last menstrual period was 03/19/2019.  Obstetric History: G1P0  Family History:  Family History  Problem Relation Age of Onset  . Heart Problems Mother   . Thyroid disease Mother   . Diabetes Maternal Grandmother   . Diabetes Maternal Grandfather   . Hypertension Maternal Grandfather   . Diabetes Maternal Aunt   . Diabetes Maternal Aunt     Social History:  Social History    Socioeconomic History  . Marital status: Significant Other    Spouse name: Montine Circle  . Number of children: 0  . Years of education: Not on file  . Highest education level: Not on file  Occupational History  . Occupation: Network engineer    Comment: heating and air  Tobacco Use  . Smoking status: Never Smoker  . Smokeless tobacco: Never Used  Substance and Sexual Activity  . Alcohol use: Not Currently    Comment: rare;u  . Drug use: No  . Sexual activity: Yes    Birth control/protection: None  Other Topics Concern  . Not on file  Social History Narrative  . Not on file   Social Determinants of Health   Financial Resource Strain:   . Difficulty of Paying Living Expenses:   Food Insecurity:   . Worried About Charity fundraiser in the Last Year:   . Arboriculturist in the Last Year:   Transportation Needs:   . Film/video editor (Medical):   Marland Kitchen Lack of Transportation (Non-Medical):   Physical Activity:   . Days of Exercise per Week:   . Minutes of Exercise per Session:   Stress:   . Feeling of Stress :   Social Connections:   . Frequency of Communication with Friends and Family:   . Frequency of Social Gatherings with Friends and Family:   . Attends Religious Services:   . Active Member of Clubs or Organizations:   . Attends Archivist Meetings:   Marland Kitchen Marital Status:   Intimate Partner Violence:   . Fear of Current or Ex-Partner:   . Emotionally Abused:   Marland Kitchen Physically Abused:   . Sexually Abused:     Allergies:  Allergies  Allergen Reactions  . Aluminum Hives and Rash  . Aluminum-Containing Compounds Hives  . Other     Trees, weeds, grass, dust mites.    Medications:  Current Outpatient Medications:  .  acyclovir (ZOVIRAX) 400 MG tablet, Take 400 mg by mouth as needed., Disp: , Rfl:  .  docusate sodium (COLACE) 100 MG capsule, Take 100 mg by mouth daily as needed for mild constipation., Disp: , Rfl:  .  fluticasone (FLONASE) 50 MCG/ACT nasal  spray, Place 2 sprays into both nostrils daily. As needed for nasal congestion, Disp: 1 g, Rfl: 1 .  pantoprazole (PROTONIX) 40 MG tablet, Take 40 mg by mouth daily., Disp: , Rfl:  .  polyethylene glycol (MIRALAX / GLYCOLAX) 17 g packet, Take 17 g by mouth 2 (two) times daily., Disp: 14 each, Rfl: 0 .  prenatal vitamin w/FE, FA (NATACHEW) 29-1 MG CHEW chewable tablet, Chew 1 tablet by mouth daily at 12 noon., Disp: , Rfl:  .  cetirizine (ZYRTEC) 10 MG tablet, Take 10 mg by mouth daily., Disp: , Rfl:  .  ondansetron (ZOFRAN) 4 MG tablet, Take 4 mg by mouth every 8 (eight) hours as needed for nausea or vomiting., Disp: , Rfl:  Physical Exam Vitals: BP 110/60   Wt 239 lb (108.4 kg)   LMP 03/19/2019   BMI 41.02 kg/m   General: WF in  NAD HEENT: normocephalic, anicteric. PEARL Thyroid: no enlargement, no palpable nodules Pulmonary: No increased work of breathing, CTAB Cardiovascular: RRR, without murmur Abdomen: soft, non-tender, non-distended.  Umbilicus without lesions.  No hepatomegaly or masses palpable. No evidence of hernia  Genitourinary:  External: Normal external female genitalia.  Normal urethral meatus, normal Bartholin's and Skene's glands.    Vagina: Normal vaginal mucosa, no evidence of prolapse.    Cervix: Grossly normal in appearance, no bleeding  Uterus: MP,  8-10 week size mobile, normal contour, NT  Adnexa: ovaries non-enlarged, no adnexal masses  Rectal: deferred Extremities: no edema, erythema, or tenderness Neurologic: Grossly intact Psychiatric: mood appropriate, affect full   Assessment: 29 y.o. G1P0 at [redacted]w[redacted]d presenting to initiate prenatal care BMI 41.02 kg/m2 Hx of herpes genitalia Anxiety Nausea of pregnancy  Plan: 1) Avoid alcoholic beverages. 2) RX for Diclegis sent to pharmacy  3) Discontinue the use of all non-medicinal drugs and chemicals.  4) Take prenatal vitamins daily.  5) Nutrition, food safety (fish, cheese advisories, and high nitrite foods)  and exercise discussed. 6) Hospital and practice style discussed with cross coverage system.  7) Genetic Screening, such as with 1st Trimester Screening, cell free fetal DNA, AFP testing, and Ultrasound,  is discussed with patient. At the conclusion of today's visit patient requested cell free DNA testing 8) Patient is asked about travel to areas at risk for the Zika virus, and counseled to avoid travel and exposure to mosquitoes or sexual partners who may have themselves been exposed to the virus. Testing is discussed, and will be ordered as appropriate.  9) Pap, cultures, NOB labs, UDS, urine culture today 10) 1 hour GTT, and MAterniT 21, CMP with dating scan in 2 weeks. 11) Discussed HSV PPX at 36 weeks.  Farrel Conners, CNM

## 2019-05-19 LAB — RPR+RH+ABO+RUB AB+AB SCR+CB...
Antibody Screen: NEGATIVE
HIV Screen 4th Generation wRfx: NONREACTIVE
Hematocrit: 40.6 % (ref 34.0–46.6)
Hemoglobin: 13.4 g/dL (ref 11.1–15.9)
Hepatitis B Surface Ag: NEGATIVE
MCH: 30.7 pg (ref 26.6–33.0)
MCHC: 33 g/dL (ref 31.5–35.7)
MCV: 93 fL (ref 79–97)
Platelets: 274 10*3/uL (ref 150–450)
RBC: 4.37 x10E6/uL (ref 3.77–5.28)
RDW: 12.4 % (ref 11.7–15.4)
RPR Ser Ql: NONREACTIVE
Rh Factor: POSITIVE
Rubella Antibodies, IGG: 6.08 index (ref >0.99)
Varicella zoster IgG: 3402 index (ref >165)
WBC: 10.6 10*3/uL (ref 3.4–10.8)

## 2019-05-20 ENCOUNTER — Encounter: Payer: Self-pay | Admitting: Certified Nurse Midwife

## 2019-05-20 DIAGNOSIS — Z6841 Body Mass Index (BMI) 40.0 and over, adult: Secondary | ICD-10-CM | POA: Insufficient documentation

## 2019-05-20 DIAGNOSIS — O0991 Supervision of high risk pregnancy, unspecified, first trimester: Secondary | ICD-10-CM | POA: Insufficient documentation

## 2019-05-20 DIAGNOSIS — O99210 Obesity complicating pregnancy, unspecified trimester: Secondary | ICD-10-CM | POA: Insufficient documentation

## 2019-05-20 DIAGNOSIS — A6000 Herpesviral infection of urogenital system, unspecified: Secondary | ICD-10-CM | POA: Insufficient documentation

## 2019-05-20 LAB — URINE CULTURE: Organism ID, Bacteria: NO GROWTH

## 2019-05-20 MED ORDER — DOXYLAMINE-PYRIDOXINE 10-10 MG PO TBEC: 2.0000 | DELAYED_RELEASE_TABLET | Freq: Every day | ORAL | 5 refills | Status: DC

## 2019-05-21 LAB — CYTOLOGY - PAP
Chlamydia: NEGATIVE
Comment: NEGATIVE
Comment: NEGATIVE
Comment: NORMAL
Diagnosis: NEGATIVE
Neisseria Gonorrhea: NEGATIVE
Trichomonas: NEGATIVE

## 2019-05-23 LAB — URINE DRUG PANEL 7
Amphetamines, Urine: NEGATIVE ng/mL
Barbiturate Quant, Ur: NEGATIVE ng/mL
Benzodiazepine Quant, Ur: NEGATIVE ng/mL
Cannabinoid Quant, Ur: POSITIVE — AB
Cocaine (Metab.): NEGATIVE ng/mL
Opiate Quant, Ur: NEGATIVE ng/mL
PCP Quant, Ur: NEGATIVE ng/mL

## 2019-05-31 ENCOUNTER — Other Ambulatory Visit: Payer: Self-pay

## 2019-05-31 ENCOUNTER — Ambulatory Visit (INDEPENDENT_AMBULATORY_CARE_PROVIDER_SITE_OTHER): Payer: Medicaid Other

## 2019-05-31 ENCOUNTER — Encounter: Payer: Self-pay | Admitting: Advanced Practice Midwife

## 2019-05-31 ENCOUNTER — Ambulatory Visit (INDEPENDENT_AMBULATORY_CARE_PROVIDER_SITE_OTHER): Payer: Medicaid Other | Admitting: Advanced Practice Midwife

## 2019-05-31 VITALS — BP 118/74 | Wt 238.0 lb

## 2019-05-31 DIAGNOSIS — O0990 Supervision of high risk pregnancy, unspecified, unspecified trimester: Secondary | ICD-10-CM

## 2019-05-31 DIAGNOSIS — O98311 Other infections with a predominantly sexual mode of transmission complicating pregnancy, first trimester: Secondary | ICD-10-CM

## 2019-05-31 DIAGNOSIS — Z3A08 8 weeks gestation of pregnancy: Secondary | ICD-10-CM

## 2019-05-31 DIAGNOSIS — A6009 Herpesviral infection of other urogenital tract: Secondary | ICD-10-CM

## 2019-05-31 DIAGNOSIS — Z3689 Encounter for other specified antenatal screening: Secondary | ICD-10-CM | POA: Diagnosis not present

## 2019-05-31 DIAGNOSIS — O0991 Supervision of high risk pregnancy, unspecified, first trimester: Secondary | ICD-10-CM

## 2019-05-31 DIAGNOSIS — Z3A10 10 weeks gestation of pregnancy: Secondary | ICD-10-CM

## 2019-05-31 DIAGNOSIS — O09891 Supervision of other high risk pregnancies, first trimester: Secondary | ICD-10-CM

## 2019-05-31 DIAGNOSIS — O99211 Obesity complicating pregnancy, first trimester: Secondary | ICD-10-CM

## 2019-05-31 MED ORDER — VITAFOL GUMMIES 3.33-0.333-34.8 MG PO CHEW: 1.0000 | CHEWABLE_TABLET | Freq: Every day | ORAL | 4 refills | Status: DC

## 2019-05-31 NOTE — Progress Notes (Signed)
No vb. No lof. Pt wants RX for VitaFol gummy PNV.

## 2019-05-31 NOTE — Patient Instructions (Signed)

## 2019-05-31 NOTE — Progress Notes (Signed)
  Routine Prenatal Care Visit  Subjective  Kelly Baxter is a 29 y.o. G1P0 at [redacted]w[redacted]d being seen today for ongoing prenatal care.  She is currently monitored for the following issues for this high-risk pregnancy and has Supervision of high risk pregnancy in first trimester; Obesity in pregnancy; Adult BMI 40.0-44.9 kg/sq m (HCC); Genital herpes simplex; and Anxiety, generalized on their problem list.  ----------------------------------------------------------------------------------- Patient reports no complaints.    . Vag. Bleeding: None.   . Leaking Fluid denies.  ----------------------------------------------------------------------------------- The following portions of the patient's history were reviewed and updated as appropriate: allergies, current medications, past family history, past medical history, past social history, past surgical history and problem list. Problem list updated.  Objective  Blood pressure 118/74, weight 238 lb (108 kg), last menstrual period 03/19/2019. Pregravid weight 255 lb (115.7 kg) Total Weight Gain -17 lb (-7.711 kg) Urinalysis: Urine Protein    Urine Glucose    Fetal Status: Fetal Heart Rate (bpm): 170          Dating scan: [redacted]w[redacted]d with EDD of 12/25/19, no adjustment made for 1 day difference  General:  Alert, oriented and cooperative. Patient is in no acute distress.  Skin: Skin is warm and dry. No rash noted.   Cardiovascular: Normal heart rate noted  Respiratory: Normal respiratory effort, no problems with respiration noted  Abdomen: Soft, gravid, appropriate for gestational age.       Pelvic:  Cervical exam deferred        Extremities: Normal range of motion.     Mental Status: Normal mood and affect. Normal behavior. Normal judgment and thought content.   Assessment   29 y.o. G1P0 at [redacted]w[redacted]d by  12/24/2019, by Last Menstrual Period presenting for routine prenatal visit  Plan   FIRST Problems (from 05/18/19 to present)    Problem Noted Resolved    Supervision of high risk pregnancy in first trimester 05/20/2019 by Farrel Conners, CNM No    MaterniT 21 today   Preterm labor symptoms and general obstetric precautions including but not limited to vaginal bleeding, contractions, leaking of fluid and fetal movement were reviewed in detail with the patient. Please refer to After Visit Summary for other counseling recommendations.   Return in about 4 weeks (around 06/28/2019) for rob.  Tresea Mall, CNM 05/31/2019 3:32 PM

## 2019-06-04 ENCOUNTER — Ambulatory Visit
Admission: EM | Admit: 2019-06-04 | Discharge: 2019-06-04 | Disposition: A | Discharge status: Home / Self Care | Payer: Medicaid Other | Attending: Urgent Care | Admitting: Urgent Care

## 2019-06-04 ENCOUNTER — Other Ambulatory Visit: Payer: Self-pay

## 2019-06-04 DIAGNOSIS — U071 COVID-19: Secondary | ICD-10-CM | POA: Insufficient documentation

## 2019-06-04 DIAGNOSIS — J069 Acute upper respiratory infection, unspecified: Secondary | ICD-10-CM | POA: Insufficient documentation

## 2019-06-04 DIAGNOSIS — H66001 Acute suppurative otitis media without spontaneous rupture of ear drum, right ear: Secondary | ICD-10-CM | POA: Insufficient documentation

## 2019-06-04 DIAGNOSIS — Z20822 Contact with and (suspected) exposure to covid-19: Secondary | ICD-10-CM

## 2019-06-04 LAB — SARS CORONAVIRUS 2 AG (30 MIN TAT): SARS Coronavirus 2 Ag: NEGATIVE

## 2019-06-04 LAB — GROUP A STREP BY PCR: Group A Strep by PCR: NOT DETECTED

## 2019-06-04 MED ORDER — AMOXICILLIN 875 MG PO TABS: 875.0000 mg | ORAL_TABLET | Freq: Two times a day (BID) | ORAL | 0 refills | Status: AC

## 2019-06-04 NOTE — Discharge Instructions (Addendum)
It was very nice seeing you today in clinic. Thank you for entrusting me with your care.   Rest and Stay HYDRATED. Water and electrolyte containing beverages (Gatorade, Pedialyte) are best to prevent dehydration and electrolyte abnormalities. Please utilize the medications that we discussed. Your prescriptions has been called in to your pharmacy. May use Tylenol as needed for pain and fever.   You were tested for SARS-CoV-2 (novel coronavirus) today. Testing is being processed at the main campus of [facility] in [place], and have been taking 12-24 hours to come back. Current recommendations from the the CDC and [place] DHHS require that you remain out of work in order to quarantine at home until negative test results are have been received. In the event that your test results are positive, you will be contacted with further directives. These measures are being implemented out of an abundance of caution to prevent transmission and spread during the current SARS-CoV-2 pandemic.  Make arrangements to follow up with your regular doctor in 1 week for re-evaluation if not improving. If your symptoms/condition worsens, please seek follow up care either here or in the ER. Please remember, our [facility] providers are "right here with you" when you need [place].   Again, it was my pleasure to take care of you today. Thank you for choosing our clinic. I hope that you start to feel better quickly.   Quentin Mulling, MSN, APRN, FNP-C, CEN Advanced Practice Provider [facility]

## 2019-06-04 NOTE — ED Triage Notes (Signed)
Pt states 10 days of runny nose, sneezing, and now throat hurts, bodyaches, can't smell or taste. [redacted] weeks pregnant.

## 2019-06-05 ENCOUNTER — Telehealth: Payer: Self-pay

## 2019-06-05 LAB — SARS CORONAVIRUS 2 (TAT 6-24 HRS): SARS Coronavirus 2: POSITIVE — AB

## 2019-06-05 NOTE — Telephone Encounter (Signed)
Pt calling to let us know she was posiitve for COVID. Marland Kitchen Tried to call pt to pt to check on her. Please let me know when she calls back. Pt has appt 5/6 so she should be fine by then

## 2019-06-06 LAB — MATERNIT 21 PLUS CORE, BLOOD
Fetal Fraction: 7
Result (T21): NEGATIVE
Trisomy 13 (Patau syndrome): NEGATIVE
Trisomy 18 (Edwards syndrome): NEGATIVE
Trisomy 21 (Down syndrome): NEGATIVE

## 2019-06-06 NOTE — ED Provider Notes (Signed)
[place], [place]   Name: Kelly Baxter DOB: May 09, 1990 MRN: [id number] CSN: [id number] PCP: Nelwyn Salisbury, Hershal Coria  Arrival date and time:  06/04/19 1706  Chief Complaint:  Cough, Generalized Body Aches, and Otalgia  NOTE: Prior to seeing the patient today, I have reviewed the triage nursing documentation and vital signs. Clinical staff has updated patient's PMH/PSHx, current medication list, and drug allergies/intolerances to ensure comprehensive history available to assist in medical decision making.   History:   HPI: Kelly Baxter is a 29 y.o. female who presents today with complaints of fatigue, cough, congestion, sore throat, and pain in her RIGHT ear. Symptoms have been progressive over the course of the last 10 days. Patient denies fevers. Cough has been non-productive with no associated shortness of breath or wheezing. She denies that she has experienced any nausea, vomiting, diarrhea, or abdominal pain. She is eating and drinking well. Patient  confirms perceived alterations to her sense of taste and smell, however she is unsure if this is simply due to her congestion. Patient presents out of concerns for her personal health as she is currently an 48 week primigravid per her report. Patient denies being in close contact with anyone known to be ill. She has never been tested for SARS-CoV-2 (novel coronavirus) per her report. Patient was not vaccinated for influenza this season. In efforts to conservatively manage her symptoms at home, the patient notes that she has used pseduoephedrine, which has not helped to improve her symptoms.    Past Medical History:  Diagnosis Date  . Adult BMI 40.0-44.9 kg/sq m ([place])   . Anxiety   . GERD (gastroesophageal reflux disease)   . Herpes   . Obesity affecting pregnancy     Past Surgical History:  Procedure Laterality Date  . NO PAST SURGERIES      Family History  Problem Relation Age of Onset  . Heart Problems Mother   . Thyroid disease  Mother   . Diabetes Maternal Grandmother   . Diabetes Maternal Grandfather   . Hypertension Maternal Grandfather   . Diabetes Maternal Aunt   . Diabetes Maternal Aunt     Social History   Tobacco Use  . Smoking status: Never Smoker  . Smokeless tobacco: Never Used  Substance Use Topics  . Alcohol use: Not Currently  . Drug use: No    Patient Active Problem List   Diagnosis Date Noted  . Supervision of high risk pregnancy in first trimester 05/20/2019  . Obesity in pregnancy 05/20/2019  . Adult BMI 40.0-44.9 kg/sq m ([place]) 05/20/2019  . Genital herpes simplex 05/20/2019  . Anxiety, generalized 02/07/2017    Home Medications:    No outpatient medications have been marked as taking for the 06/04/19 encounter [facility] Encounter).    Allergies:   Aluminum, Aluminum-containing compounds, and Other  Review of Systems (ROS):  Review of systems NEGATIVE unless otherwise noted in narrative H&P section.   Vital Signs: Today's Vitals   06/04/19 1717 06/04/19 1719 06/04/19 1818  BP: 112/68    Pulse: 67    Resp: 16    Temp: 97.9 F (36.6 C)    TempSrc: Oral    SpO2: 100%    Weight:  235 lb (106.6 kg)   Height:  5' 4.5" (1.638 m)   PainSc:  7  7     Physical Exam: Physical Exam  Constitutional: She is oriented to person, place, and time and well-developed, well-nourished, and in no distress.  HENT:  Head: Normocephalic  and atraumatic.  Right Ear: There is tenderness. Tympanic membrane is erythematous and bulging (mild). A middle ear effusion (suppurative) is present.  Nose: Mucosal edema and rhinorrhea present. No sinus tenderness.  Mouth/Throat: Uvula is midline and mucous membranes are normal. Posterior oropharyngeal erythema present. No oropharyngeal exudate or posterior oropharyngeal edema.  Eyes: Pupils are equal, round, and reactive to light.  Cardiovascular: Normal rate, regular rhythm, normal heart sounds and intact distal pulses.  Pulmonary/Chest: Effort  normal and breath sounds normal.  No cough noted in clinic. No SOB or increased WOB. No distress. Able to speak in complete sentences without difficulties. SPO2 100% on RA.  Lymphadenopathy:       Head (right side): Submandibular adenopathy present.  Neurological: She is alert and oriented to person, place, and time. Gait normal.  Skin: Skin is warm and dry. No rash noted. She is not diaphoretic.  Psychiatric: Mood, memory, affect and judgment normal.  Nursing note and vitals reviewed.   Urgent Care Treatments / Results:   Orders Placed This Encounter  Procedures  . SARS CORONAVIRUS 2 (TAT 6-24 HRS) Nasopharyngeal Nasopharyngeal Swab  . Group A Strep by PCR  . SARS Coronavirus 2 Ag (30 min TAT) - Nasal Swab (BD Veritor Kit)    LABS: PLEASE NOTE: all labs that were ordered this encounter are listed, however only abnormal results are displayed.  SARS CORONAVIRUS 2 (TAT 6-24 HRS) GROUP A STREP BY PCR SARS CORONAVIRUS 2 AG (30 MIN TAT)  EKG: -None  RADIOLOGY: No results found.  PROCEDURES: Procedures  MEDICATIONS RECEIVED THIS VISIT: Medications - No data to display  PERTINENT CLINICAL COURSE NOTES/UPDATES:   Initial Impression / Assessment and Plan / Urgent Care Course:  Pertinent labs & imaging results that were available during my care of the patient were personally reviewed by me and considered in my medical decision making (see lab/imaging section of note for values and interpretations).  Kelly Baxter is a 29 y.o. female who presents to [facility] today with complaints of Cough, Generalized Body Aches, and Otalgia  Patient overall well appearing and in no acute distress today in clinic. Presenting symptoms (see HPI) and exam as documented above. She presents with symptoms associated with SARS-CoV-2 (novel coronavirus). Discussed typical symptom constellation. Reviewed potential for infection and need for testing. Patient amenable to being tested. Rapid  SARS-CoV-2 Ag swab collected by certified clinical staff; results negative. Given symptom constellation and exposure the decision was made to send more sensitive molecular PCR testing to further assess for the patient being infected with the SARS-CoV-2 virus. Discussed variable turn around times associated with testing, as swabs are being processed at the main campus of [facility] in [place], and have been taking 12-24 hours to come back. She was advised to self quarantine, per [facility] DHHS guidelines, until negative results received. These measures are being implemented out of an abundance of caution to prevent transmission and spread during the current SARS-CoV-2 pandemic.   Patient has co-morbidities that increase her SARS-CoV-2 morbidity and mortality risk including Body mass index is 39.71 kg/m. If patient found to be positive for SARS-CoV-2, her co-morbidities would qualify her for treatment with the monoclonal antibody (bamlanivimab) infusion, however I am unsure if she can receive this treatment given her current pregnancy status. Given the degree of her current symptoms, I would say that the risks outweigh the benefits. Outpatient COVID response team to determine eligibility and contact the patient to discuss if she is deemed to be eligible  for the infusion treatment.  PCR streptococcal throat swab (-). Presenting symptoms consistent with URI with concurrent AOME on the RIGHT. Discussed that, until ruled out with confirmatory lab testing, SARS-CoV-2 remains part of the differential. Her testing is pending at this time. Discussed that concurrent bacterial and viral infections are possible. Given the appearance of her ear, will proceed with treatment using a 10 day course of amoxicillin. Discussed supportive care measures at home during acute phase of illness. Patient to rest as much as possible. She was encouraged to ensure adequate hydration (water and ORS) to prevent dehydration and electrolyte  derangements.  Intervention for cough offered, however patient declined citing that her symptoms are mild/controlled. Recommended warm salt water gargles, hard candies/lozenges, and hot tea with honey/lemon to help soothe the throat and reduce irritation. May use Tylenol and/or Ibuprofen as needed for pain/fever.   Current clinical condition warrants patient being out of work in order to quarantine while waiting for testing results. She was provided with the appropriate documentation to provide to her place of employment that will allow for her to RTW on 06/06/2019 with no restrictions. RTW is contingent on her SARS-CoV-2 test results being reviewed as negative.     Discussed follow up with primary care physician in 1 week for re-evaluation. I have reviewed the follow up and strict return precautions for any new or worsening symptoms. Patient is aware of symptoms that would be deemed urgent/emergent, and would thus require further evaluation either here or in the emergency department. At the time of discharge, she verbalized understanding and consent with the discharge plan as it was reviewed with her. All questions were fielded by provider and/or clinic staff prior to patient discharge.    Final Clinical Impressions / Urgent Care Diagnoses:   Final diagnoses:  Upper respiratory tract infection, unspecified type  Non-recurrent acute suppurative otitis media of right ear without spontaneous rupture of tympanic membrane  Encounter for laboratory testing for COVID-19 virus    New Prescriptions:  [place] Controlled Substance Registry consulted? Not Applicable  Meds ordered this encounter  Medications  . amoxicillin (AMOXIL) 875 MG tablet    Sig: Take 1 tablet (875 mg total) by mouth 2 (two) times daily for 7 days.    Dispense:  14 tablet    Refill:  0    Recommended Follow up Care:  Patient encouraged to follow up with the following provider within the specified time frame, or sooner as dictated by  the severity of her symptoms. As always, she was instructed that for any urgent/emergent care needs, she should seek care either here or in the emergency department for more immediate evaluation.  Follow-up Information    Nelwyn Salisbury, [place] In 1 week.   Specialty: Physician Assistant Why: General reassessment of symptoms if not improving Contact information: [place] [address] [phone]         NOTE: This note was prepared using Dragon dictation software along with smaller phrase technology. Despite my best ability to proofread, there is the potential that transcriptional errors may still occur from this process, and are completely unintentional.    Karen Kitchens, NP 06/06/19 1229

## 2019-06-28 ENCOUNTER — Other Ambulatory Visit: Payer: Self-pay

## 2019-06-28 ENCOUNTER — Ambulatory Visit (INDEPENDENT_AMBULATORY_CARE_PROVIDER_SITE_OTHER): Payer: Medicaid Other | Admitting: Advanced Practice Midwife

## 2019-06-28 ENCOUNTER — Encounter: Payer: Self-pay | Admitting: Advanced Practice Midwife

## 2019-06-28 VITALS — BP 120/80 | Wt 237.0 lb

## 2019-06-28 DIAGNOSIS — Z3A14 14 weeks gestation of pregnancy: Secondary | ICD-10-CM

## 2019-06-28 DIAGNOSIS — O0991 Supervision of high risk pregnancy, unspecified, first trimester: Secondary | ICD-10-CM

## 2019-06-28 DIAGNOSIS — O99210 Obesity complicating pregnancy, unspecified trimester: Secondary | ICD-10-CM

## 2019-06-28 LAB — POCT URINALYSIS DIPSTICK OB
Glucose, UA: NEGATIVE
POC,PROTEIN,UA: NEGATIVE

## 2019-06-28 NOTE — Progress Notes (Signed)
  Routine Prenatal Care Visit  Subjective  Kelly Baxter is a 29 y.o. G1P0 at [redacted]w[redacted]d being seen today for ongoing prenatal care.  She is currently monitored for the following issues for this high-risk pregnancy and has Supervision of high risk pregnancy in first trimester; Obesity in pregnancy; Adult BMI 40.0-44.9 kg/sq m (HCC); Genital herpes simplex; and Anxiety, generalized on their problem list.  ----------------------------------------------------------------------------------- Patient reports no complaints.    . Vag. Bleeding: None.   . Leaking Fluid denies.  ----------------------------------------------------------------------------------- The following portions of the patient's history were reviewed and updated as appropriate: allergies, current medications, past family history, past medical history, past social history, past surgical history and problem list. Problem list updated.  Objective  Blood pressure 120/80, weight 237 lb (107.5 kg), last menstrual period 03/19/2019. Pregravid weight 255 lb (115.7 kg) Total Weight Gain -18 lb (-8.165 kg) Urinalysis: Urine Protein Negative  Urine Glucose Negative  Fetal Status: Fetal Heart Rate (bpm): 144         General:  Alert, oriented and cooperative. Patient is in no acute distress.  Skin: Skin is warm and dry. No rash noted.   Cardiovascular: Normal heart rate noted  Respiratory: Normal respiratory effort, no problems with respiration noted  Abdomen: Soft, gravid, appropriate for gestational age.       Pelvic:  Cervical exam deferred        Extremities: Normal range of motion.     Mental Status: Normal mood and affect. Normal behavior. Normal judgment and thought content.   Assessment   29 y.o. G1P0 at [redacted]w[redacted]d by  12/24/2019, by Last Menstrual Period presenting for routine prenatal visit  Plan   FIRST Problems (from 05/18/19 to present)    Problem Noted Resolved   Supervision of high risk pregnancy in first trimester 05/20/2019  by Farrel Conners, CNM No   Overview Signed 05/31/2019  3:35 PM by Tresea Mall, CNM    [facility] Prenatal Labs  Dating EDD by LMP c/w 10w u/s Blood type: O/Positive/-- (03/26 1057)   Genetic Screen 1 Screen:    AFP:     Quad:     NIPS: Antibody:Negative (03/26 1057)  Anatomic [place]  Rubella: 6.08 (03/26 1057) Varicella: @VZVIGG @  GTT Early:               Third trimester:  RPR: Non Reactive (03/26 1057)   Rhogam  HBsAg: Negative (03/26 1057)   TDaP vaccine                       Flu Shot: HIV: Non Reactive (03/26 1057)   Baby Food                                GBS:   Contraception  Pap:  CBB     CS/VBAC    Support Person               Preterm labor symptoms and general obstetric precautions including but not limited to vaginal bleeding, contractions, leaking of fluid and fetal movement were reviewed in detail with the patient.   Still needs early 1 hr gtt, p/c ratio and cmp that were future ordered at NOB visit Return in about 4 weeks (around 07/26/2019) for anatomy scan and rob.  09/25/2019, CNM 06/28/2019 4:35 PM

## 2019-07-27 ENCOUNTER — Ambulatory Visit (INDEPENDENT_AMBULATORY_CARE_PROVIDER_SITE_OTHER): Payer: Medicaid Other

## 2019-07-27 ENCOUNTER — Encounter: Payer: Self-pay | Admitting: Obstetrics and Gynecology

## 2019-07-27 ENCOUNTER — Other Ambulatory Visit: Payer: Self-pay

## 2019-07-27 ENCOUNTER — Ambulatory Visit (INDEPENDENT_AMBULATORY_CARE_PROVIDER_SITE_OTHER): Payer: Medicaid Other | Admitting: Obstetrics and Gynecology

## 2019-07-27 VITALS — BP 110/70 | Ht 64.0 in | Wt 243.2 lb

## 2019-07-27 DIAGNOSIS — O0991 Supervision of high risk pregnancy, unspecified, first trimester: Secondary | ICD-10-CM

## 2019-07-27 DIAGNOSIS — O0992 Supervision of high risk pregnancy, unspecified, second trimester: Secondary | ICD-10-CM | POA: Diagnosis not present

## 2019-07-27 DIAGNOSIS — Z3A18 18 weeks gestation of pregnancy: Secondary | ICD-10-CM | POA: Diagnosis not present

## 2019-07-27 DIAGNOSIS — O99212 Obesity complicating pregnancy, second trimester: Secondary | ICD-10-CM

## 2019-07-27 DIAGNOSIS — O99210 Obesity complicating pregnancy, unspecified trimester: Secondary | ICD-10-CM

## 2019-07-27 LAB — POCT URINALYSIS DIPSTICK OB
Glucose, UA: NEGATIVE
POC,PROTEIN,UA: NEGATIVE

## 2019-07-27 NOTE — Patient Instructions (Addendum)

## 2019-07-27 NOTE — Progress Notes (Signed)
° ° °  Routine Prenatal Care Visit  Subjective  Kelly Baxter is a 29 y.o. G1P0 at [redacted]w[redacted]d being seen today for ongoing prenatal care.  She is currently monitored for the following issues for this high-risk pregnancy and has Supervision of high risk pregnancy in first trimester; Obesity in pregnancy; Adult BMI 40.0-44.9 kg/sq m (HCC); Genital herpes simplex; and Anxiety, generalized on their problem list.  ----------------------------------------------------------------------------------- Patient reports fatigue.  Contractions: Not present. Vag. Bleeding: None.  Movement: Present. Denies leaking of fluid.  ----------------------------------------------------------------------------------- The following portions of the patient's history were reviewed and updated as appropriate: allergies, current medications, past family history, past medical history, past social history, past surgical history and problem list. Problem list updated.  Objective  Blood pressure 110/70, height 5\' 4"  (1.626 m), weight 243 lb 3.2 oz (110.3 kg), last menstrual period 03/19/2019. Pregravid weight 255 lb (115.7 kg) Total Weight Gain -11 lb 12.8 oz (-5.352 kg) Urinalysis:      Fetal Status:     Movement: Present     General:  Alert, oriented and cooperative. Patient is in no acute distress.  Skin: Skin is warm and dry. No rash noted.   Cardiovascular: Normal heart rate noted  Respiratory: Normal respiratory effort, no problems with respiration noted  Abdomen: Soft, gravid, appropriate for gestational age. Pain/Pressure: Absent     Pelvic:  Cervical exam deferred        Extremities: Normal range of motion.     Mental Status: Normal mood and affect. Normal behavior. Normal judgment and thought content.    Assessment   29 y.o. G1P0 at [redacted]w[redacted]d by  12/24/2019, by Last Menstrual Period presenting for routine prenatal visit  Plan   FIRST Problems (from 05/18/19 to present)    Problem Noted Resolved   Supervision of  high risk pregnancy in first trimester 05/20/2019 by 05/22/2019, CNM No   Overview Addendum 07/27/2019  4:51 PM by 09/26/2019, MD    [facility] Prenatal Labs  Dating EDD by LMP c/w 10w u/s Blood type: O/Positive/-- (03/26 1057)   Genetic Screen  NIPS:normal xx Antibody:Negative (03/26 1057)  Anatomic 05[birth year]-1999 complete Rubella: 6.08 (03/26 1057) Varicella: immune  GTT Early:               Third trimester:  RPR: Non Reactive (03/26 1057)   Rhogam  not needed HBsAg: Negative (03/26 1057)   TDaP vaccine                       Flu Shot: HIV: Non Reactive (03/26 1057)   Baby Food                                GBS:   Contraception  Pap: NIL 2021  CBB     CS/VBAC    Support Person              Anatomy 2022 normal Discussed 1hr GTT with next visit.  Gestational age appropriate obstetric precautions including but not limited to vaginal bleeding, contractions, leaking of fluid and fetal movement were reviewed in detail with the patient.    Return in about 2 weeks (around 08/10/2019) for ROB in person and 1 GTT.  08/12/2019 MD [facility], [facility] 07/27/2019, 4:51 PM

## 2019-07-29 ENCOUNTER — Other Ambulatory Visit: Payer: Self-pay

## 2019-07-29 ENCOUNTER — Emergency Department
Admission: EM | Admit: 2019-07-29 | Discharge: 2019-07-29 | Disposition: A | Discharge status: Home / Self Care | Payer: Medicaid Other | Attending: Emergency Medicine | Admitting: Emergency Medicine

## 2019-07-29 DIAGNOSIS — U071 COVID-19: Secondary | ICD-10-CM | POA: Insufficient documentation

## 2019-07-29 DIAGNOSIS — E860 Dehydration: Secondary | ICD-10-CM

## 2019-07-29 DIAGNOSIS — O98512 Other viral diseases complicating pregnancy, second trimester: Secondary | ICD-10-CM | POA: Insufficient documentation

## 2019-07-29 DIAGNOSIS — Z79899 Other long term (current) drug therapy: Secondary | ICD-10-CM | POA: Insufficient documentation

## 2019-07-29 DIAGNOSIS — Z3A18 18 weeks gestation of pregnancy: Secondary | ICD-10-CM | POA: Diagnosis not present

## 2019-07-29 LAB — BASIC METABOLIC PANEL
Anion gap: 9 (ref 5–15)
BUN: 9 mg/dL (ref 6–20)
CO2: 22 mmol/L (ref 22–32)
Calcium: 8.8 mg/dL — ABNORMAL LOW (ref 8.9–10.3)
Chloride: 105 mmol/L (ref 98–111)
Creatinine, Ser: 0.62 mg/dL (ref 0.44–1.00)
GFR calc Af Amer: >60 mL/min (ref >60)
GFR calc non Af Amer: >60 mL/min (ref >60)
Glucose, Bld: 102 mg/dL — ABNORMAL HIGH (ref 70–99)
Potassium: 3.3 mmol/L — ABNORMAL LOW (ref 3.5–5.1)
Sodium: 136 mmol/L (ref 135–145)

## 2019-07-29 LAB — CBC
HCT: 35 % — ABNORMAL LOW (ref 36.0–46.0)
Hemoglobin: 12.1 g/dL (ref 12.0–15.0)
MCH: 31.3 pg (ref 26.0–34.0)
MCHC: 34.6 g/dL (ref 30.0–36.0)
MCV: 90.4 fL (ref 80.0–100.0)
Platelets: 231 10*3/uL (ref 150–400)
RBC: 3.87 MIL/uL (ref 3.87–5.11)
RDW: 13.5 % (ref 11.5–15.5)
WBC: 12.6 10*3/uL — ABNORMAL HIGH (ref 4.0–10.5)
nRBC: 0 % (ref 0.0–0.2)

## 2019-07-29 LAB — URINALYSIS, COMPLETE (UACMP) WITH MICROSCOPIC
Bacteria, UA: NONE SEEN
Bilirubin Urine: NEGATIVE
Glucose, UA: NEGATIVE mg/dL
Hgb urine dipstick: NEGATIVE
Ketones, ur: NEGATIVE mg/dL
Leukocytes,Ua: NEGATIVE
Nitrite: NEGATIVE
Protein, ur: NEGATIVE mg/dL
Specific Gravity, Urine: 1.023 (ref 1.005–1.030)
pH: 5 (ref 5.0–8.0)

## 2019-07-29 MED ORDER — SODIUM CHLORIDE 0.9 % IV BOLUS
1000.0000 mL | Freq: Once | INTRAVENOUS | Status: AC
  Administered 2019-07-29: 1000 mL via INTRAVENOUS

## 2019-07-29 NOTE — ED Triage Notes (Signed)
First nurse note- here for dehydration, is pregnant. Ambulatory, NAD

## 2019-07-29 NOTE — ED Notes (Signed)
See triage note-pt reports being dehydrated and pregnant. C/o headache.

## 2019-07-29 NOTE — ED Triage Notes (Signed)
Pt states "I think i'm dehydrated" states [redacted] weeks pregnant, sees [facility]. States "I have the internal shakes, a headache, sweats." denies vaginal bleeding, cramping, discharge. States feeling bad since Thursday. States dark colored/orange urine.

## 2019-07-29 NOTE — ED Provider Notes (Signed)
Emergency Department Provider Note  ____________________________________________  Time seen: Approximately 5:13 PM  I have reviewed the triage vital signs and the nursing notes.   HISTORY  Chief Complaint Dehydration   Historian Patient     HPI Kelly Baxter is a 29 y.o. female G1, P0 currently [redacted] weeks pregnant, presents to the emergency department with a sensation of weakness and occasional headache.  Patient states that she has experienced these symptoms since starting her second trimester.  She has addressed her concerns with her OB/GYN who recommended some over-the-counter supplements.  Patient also states that she feels like she is dehydrated but does not have any nausea or vomiting.  She has been drinking approximately 1 to 2 L a day.  She states that her headache is relieved with Tylenol at home.  No dysuria, hematuria or increased urinary frequency.  She denies vaginal bleeding, pelvic pain or abdominal pain.  Denies shortness of breath, cough or chest tightness.  No rhinorrhea or nasal congestion.  She states that she recovered well from COVID-19 in April.   Past Medical History:  Diagnosis Date   Adult BMI 40.0-44.9 kg/sq m (HCC)    Anxiety    GERD (gastroesophageal reflux disease)    Herpes    Obesity affecting pregnancy      Immunizations up to date:  Yes.     Past Medical History:  Diagnosis Date   Adult BMI 40.0-44.9 kg/sq m [facility])    Anxiety    GERD (gastroesophageal reflux disease)    Herpes    Obesity affecting pregnancy     Patient Active Problem List   Diagnosis Date Noted   Supervision of high risk pregnancy in first trimester 05/20/2019   Obesity in pregnancy 05/20/2019   Adult BMI 40.0-44.9 kg/sq m ([facility]) 05/20/2019   Genital herpes simplex 05/20/2019   Anxiety, generalized 02/07/2017    Past Surgical History:  Procedure Laterality Date   NO PAST SURGERIES      Prior to Admission medications   Medication Sig Start  Date End Date Taking? Authorizing Provider  acyclovir (ZOVIRAX) 400 MG tablet Take 400 mg by mouth as needed. 01/22/19   [provider]  docusate sodium (COLACE) 100 MG capsule Take 100 mg by mouth daily as needed for mild constipation.    [provider]  fluticasone (FLONASE) 50 MCG/ACT nasal spray Place 2 sprays into both nostrils daily. As needed for nasal congestion 01/08/15   Paulina Fusi, MD  pantoprazole (PROTONIX) 40 MG tablet Take 40 mg by mouth daily. 11/28/18   [provider]  polyethylene glycol (MIRALAX / GLYCOLAX) 17 g packet Take 17 g by mouth 2 (two) times daily. 05/11/19   Earleen Newport, MD  cetirizine (ZYRTEC) 10 MG tablet Take 10 mg by mouth daily.  06/04/19  [provider]    Allergies Aluminum, Aluminum-containing compounds, and Other  Family History  Problem Relation Age of Onset   Heart Problems Mother    Thyroid disease Mother    Diabetes Maternal Grandmother    Diabetes Maternal Grandfather    Hypertension Maternal Grandfather    Diabetes Maternal Aunt    Diabetes Maternal Aunt     Social History Social History   Tobacco Use   Smoking status: Never Smoker   Smokeless tobacco: Never Used  Substance Use Topics   Alcohol use: Not Currently   Drug use: No     Review of Systems  Constitutional: No fever/chills Eyes:  No discharge ENT: No upper  respiratory complaints. Respiratory: no cough. No SOB/ use of accessory muscles to breath Gastrointestinal:   No nausea, no vomiting.  No diarrhea.  No constipation. Musculoskeletal: Negative for musculoskeletal pain. Skin: Negative for rash, abrasions, lacerations, ecchymosis.    ____________________________________________   PHYSICAL EXAM:  VITAL SIGNS: ED Triage Vitals  Enc Vitals Group     BP 07/29/19 1343 120/62     Pulse Rate 07/29/19 1343 92     Resp 07/29/19 1343 16     Temp 07/29/19 1343 98.9 F (37.2 C)     Temp Source 07/29/19 1343  Oral     SpO2 07/29/19 1343 98 %     Weight 07/29/19 1344 240 lb (108.9 kg)     Height 07/29/19 1344 5\' 5"  (1.651 m)     Head Circumference --      Peak Flow --      Pain Score 07/29/19 1344 5     Pain Loc --      Pain Edu? --      Excl. in GC? --      Constitutional: Alert and oriented. Well appearing and in no acute distress. Eyes: Conjunctivae are normal. PERRL. EOMI. Head: Atraumatic. ENT:      Ears: TMs are pearly.       Nose: No congestion/rhinnorhea.      Mouth/Throat: Mucous membranes are moist.  Neck: No stridor.  No cervical spine tenderness to palpation. Cardiovascular: Normal rate, regular rhythm. Normal S1 and S2.  Good peripheral circulation. Respiratory: Normal respiratory effort without tachypnea or retractions. Lungs CTAB. Good air entry to the bases with no decreased or absent breath sounds Gastrointestinal: Bowel sounds x 4 quadrants. Soft and nontender to palpation. No guarding or rigidity. No distention. Musculoskeletal: Full range of motion to all extremities. No obvious deformities noted Neurologic:  Normal for age. No gross focal neurologic deficits are appreciated.  Skin:  Skin is warm, dry and intact. No rash noted. Psychiatric: Mood and affect are normal for age. Speech and behavior are normal.   ____________________________________________   LABS (all labs ordered are listed, but only abnormal results are displayed)  Labs Reviewed  CBC - Abnormal; Notable for the following components:      Result Value   WBC 12.6 (*)    HCT 35.0 (*)    All other components within normal limits  BASIC METABOLIC PANEL - Abnormal; Notable for the following components:   Potassium 3.3 (*)    Glucose, Bld 102 (*)    Calcium 8.8 (*)    All other components within normal limits  URINALYSIS, COMPLETE (UACMP) WITH MICROSCOPIC - Abnormal; Notable for the following components:   Color, Urine YELLOW (*)    APPearance HAZY (*)    All other components within normal limits   SARS CORONAVIRUS 2 (TAT 6-24 HRS)   ____________________________________________  EKG   ____________________________________________  RADIOLOGY   No results found.  ____________________________________________    PROCEDURES  Procedure(s) performed:     Procedures     Medications  sodium chloride 0.9 % bolus 1,000 mL (0 mLs Intravenous Stopped 07/29/19 1644)  sodium chloride 0.9 % bolus 1,000 mL (1,000 mLs Intravenous New Bag/Given 07/29/19 1713)     ____________________________________________   INITIAL IMPRESSION / ASSESSMENT AND PLAN / ED COURSE  Pertinent labs & imaging results that were available during my care of the patient were reviewed by me and considered in my medical decision making (see chart for details).      Assessment and  plan Dehydration.  29 year old female presents to the emergency department with a sensation of weakness that has occurred since she entered the second trimester.   Patient was hemodynamically stable.  She was able to provide historical information.  Abdomen was soft and nontender without guarding.  Fetal heart tones were assessed by my self at 162 bpm.  CBC and BMP were reassuring.  Urinalysis was noncontributory for cystitis.  Send off COVID-19 testing was requested by patient.  Patient has a follow-up appointment with her OB/GYN in 2 weeks.  Encouraged hydration home.  Return precautions were given to return with new or worsening symptoms.   ____________________________________________  FINAL CLINICAL IMPRESSION(S) / ED DIAGNOSES  Final diagnoses:  Dehydration      NEW MEDICATIONS STARTED DURING THIS VISIT:  ED Discharge Orders    None          This chart was dictated using voice recognition software/Dragon. Despite best efforts to proofread, errors can occur which can change the meaning. Any change was purely unintentional.     Orvil Feil, PA-C 07/29/19 1839    Minna Antis, MD 07/30/19  2148

## 2019-07-30 LAB — SARS CORONAVIRUS 2 (TAT 6-24 HRS): SARS Coronavirus 2: POSITIVE — AB

## 2019-08-14 ENCOUNTER — Other Ambulatory Visit: Payer: Self-pay

## 2019-08-14 ENCOUNTER — Ambulatory Visit (INDEPENDENT_AMBULATORY_CARE_PROVIDER_SITE_OTHER): Payer: Medicaid Other | Admitting: Obstetrics and Gynecology

## 2019-08-14 ENCOUNTER — Encounter: Payer: Self-pay | Admitting: Obstetrics and Gynecology

## 2019-08-14 ENCOUNTER — Other Ambulatory Visit: Payer: Medicaid Other

## 2019-08-14 VITALS — BP 118/76 | Wt 250.0 lb

## 2019-08-14 DIAGNOSIS — A6004 Herpesviral vulvovaginitis: Secondary | ICD-10-CM

## 2019-08-14 DIAGNOSIS — Z3A21 21 weeks gestation of pregnancy: Secondary | ICD-10-CM

## 2019-08-14 DIAGNOSIS — O0992 Supervision of high risk pregnancy, unspecified, second trimester: Secondary | ICD-10-CM

## 2019-08-14 DIAGNOSIS — Z6841 Body Mass Index (BMI) 40.0 and over, adult: Secondary | ICD-10-CM

## 2019-08-14 DIAGNOSIS — O99210 Obesity complicating pregnancy, unspecified trimester: Secondary | ICD-10-CM

## 2019-08-14 DIAGNOSIS — O0990 Supervision of high risk pregnancy, unspecified, unspecified trimester: Secondary | ICD-10-CM

## 2019-08-14 NOTE — Progress Notes (Signed)
  Routine Prenatal Care Visit  Subjective  Kelly Baxter is a 29 y.o. G1P0 at [redacted]w[redacted]d being seen today for ongoing prenatal care.  She is currently monitored for the following issues for this high-risk pregnancy and has Supervision of high risk pregnancy in first trimester; Obesity in pregnancy; Adult BMI 40.0-44.9 kg/sq m (HCC); Genital herpes simplex; and Anxiety, generalized on their problem list.  ----------------------------------------------------------------------------------- Patient reports no complaints.   Contractions: Not present. Vag. Bleeding: None.  Movement: Present. Leaking Fluid denies.  ----------------------------------------------------------------------------------- The following portions of the patient's history were reviewed and updated as appropriate: allergies, current medications, past family history, past medical history, past social history, past surgical history and problem list. Problem list updated.  Objective  Blood pressure 118/76, weight 250 lb (113.4 kg), last menstrual period 03/19/2019. Pregravid weight 255 lb (115.7 kg) Total Weight Gain -5 lb (-2.268 kg) Urinalysis: Urine Protein    Urine Glucose    Fetal Status: Fetal Heart Rate (bpm): 135   Movement: Present     General:  Alert, oriented and cooperative. Patient is in no acute distress.  Skin: Skin is warm and dry. No rash noted.   Cardiovascular: Normal heart rate noted  Respiratory: Normal respiratory effort, no problems with respiration noted  Abdomen: Soft, gravid, appropriate for gestational age. Pain/Pressure: Absent     Pelvic:  Cervical exam deferred        Extremities: Normal range of motion.     Mental Status: Normal mood and affect. Normal behavior. Normal judgment and thought content.   Assessment   29 y.o. G1P0 at [redacted]w[redacted]d by  12/24/2019, by Last Menstrual Period presenting for routine prenatal visit  Plan   FIRST Problems (from 05/18/19 to present)    Problem Noted Resolved    Supervision of high risk pregnancy in first trimester 05/20/2019 by Farrel Conners, CNM No   Overview Addendum 07/27/2019  4:51 PM by Natale Milch, MD    [facility] Prenatal Labs  Dating EDD by LMP c/w 10w u/s Blood type: O/Positive/-- (03/26 1057)   Genetic Screen  NIPS:normal xx Antibody:Negative (03/26 1057)  Anatomic [place] complete Rubella: 6.08 (03/26 1057) Varicella: immune  GTT Early:               Third trimester:  RPR: Non Reactive (03/26 1057)   Rhogam  not needed HBsAg: Negative (03/26 1057)   TDaP vaccine                       Flu Shot: HIV: Non Reactive (03/26 1057)   Baby Food                                GBS:   Contraception  Pap: NIL 2021  CBB     CS/VBAC    Support Person          Previous Version      Preterm labor symptoms and general obstetric precautions including but not limited to vaginal bleeding, contractions, leaking of fluid and fetal movement were reviewed in detail with the patient. Please refer to After Visit Summary for other counseling recommendations.   Return in about 4 weeks (around 09/11/2019) for Routine Prenatal Appointment.  Thomasene Mohair, MD, [facility], [facility] 08/14/2019 3:14 PM

## 2019-08-15 LAB — CMP14+EGFR
ALT: 12 IU/L (ref 0–32)
AST: 16 IU/L (ref 0–40)
Albumin/Globulin Ratio: 1.5 (ref 1.2–2.2)
Albumin: 3.5 g/dL — ABNORMAL LOW (ref 3.9–5.0)
Alkaline Phosphatase: 106 IU/L (ref 48–121)
BUN/Creatinine Ratio: 11 (ref 9–23)
BUN: 7 mg/dL (ref 6–20)
Bilirubin Total: <0.2 mg/dL (ref 0.0–1.2)
CO2: 22 mmol/L (ref 20–29)
Calcium: 8.8 mg/dL (ref 8.7–10.2)
Chloride: 104 mmol/L (ref 96–106)
Creatinine, Ser: 0.66 mg/dL (ref 0.57–1.00)
GFR calc Af Amer: 138 mL/min/{1.73_m2} (ref >59)
GFR calc non Af Amer: 120 mL/min/{1.73_m2} (ref >59)
Globulin, Total: 2.3 g/dL (ref 1.5–4.5)
Glucose: 120 mg/dL — ABNORMAL HIGH (ref 65–99)
Potassium: 3.7 mmol/L (ref 3.5–5.2)
Sodium: 138 mmol/L (ref 134–144)
Total Protein: 5.8 g/dL — ABNORMAL LOW (ref 6.0–8.5)

## 2019-08-15 LAB — GLUCOSE, 1 HOUR GESTATIONAL: Gestational Diabetes Screen: 116 mg/dL (ref 65–139)

## 2019-08-16 LAB — PROTEIN / CREATININE RATIO, URINE
Creatinine, Urine: 126.2 mg/dL
Protein, Ur: 20.2 mg/dL
Protein/Creat Ratio: 160 mg/g creat (ref 0–200)

## 2019-08-17 ENCOUNTER — Encounter: Payer: Self-pay | Admitting: Certified Nurse Midwife

## 2019-09-14 ENCOUNTER — Encounter: Payer: Medicaid Other | Admitting: Obstetrics and Gynecology

## 2019-09-25 ENCOUNTER — Telehealth: Payer: Self-pay

## 2019-09-25 NOTE — Telephone Encounter (Signed)
After hour nurse called; pt 27wks; c/o pain in ovary area; their protocol is to send pt to L&D but to contact physician for possible down grade.  Adv we don't have any openings today.  After hour nurse will send pt to L&D.

## 2019-09-27 ENCOUNTER — Other Ambulatory Visit: Payer: Self-pay

## 2019-09-27 ENCOUNTER — Ambulatory Visit (INDEPENDENT_AMBULATORY_CARE_PROVIDER_SITE_OTHER): Payer: Medicaid Other | Admitting: Obstetrics and Gynecology

## 2019-09-27 VITALS — BP 105/70 | Wt 262.0 lb

## 2019-09-27 DIAGNOSIS — O0991 Supervision of high risk pregnancy, unspecified, first trimester: Secondary | ICD-10-CM

## 2019-09-27 DIAGNOSIS — O0992 Supervision of high risk pregnancy, unspecified, second trimester: Secondary | ICD-10-CM

## 2019-09-27 DIAGNOSIS — O99210 Obesity complicating pregnancy, unspecified trimester: Secondary | ICD-10-CM

## 2019-09-27 DIAGNOSIS — Z3A27 27 weeks gestation of pregnancy: Secondary | ICD-10-CM

## 2019-09-27 NOTE — Progress Notes (Signed)
ROB

## 2019-09-27 NOTE — Progress Notes (Signed)
Routine Prenatal Care Visit  Subjective  Kelly Baxter is a 29 y.o. G1P0 at [redacted]w[redacted]d being seen today for ongoing prenatal care.  She is currently monitored for the following issues for this high-risk pregnancy and has Supervision of high risk pregnancy in first trimester; Obesity in pregnancy; Adult BMI 40.0-44.9 kg/sq m (HCC); Genital herpes simplex; and Anxiety, generalized on their problem list.  ----------------------------------------------------------------------------------- Patient reports no complaints.   Contractions: Not present. Vag. Bleeding: None.  Movement: Present. Denies leaking of fluid.  ----------------------------------------------------------------------------------- The following portions of the patient's history were reviewed and updated as appropriate: allergies, current medications, past family history, past medical history, past social history, past surgical history and problem list. Problem list updated.   Objective  Blood pressure 105/70, weight 262 lb (118.8 kg), last menstrual period 03/19/2019. Pregravid weight 255 lb (115.7 kg) Total Weight Gain 7 lb (3.175 kg)  Body mass index is 43.6 kg/m.  Urinalysis:      Fetal Status: Fetal Heart Rate (bpm): 140 Fundal Height: 29 cm Movement: Present     General:  Alert, oriented and cooperative. Patient is in no acute distress.  Skin: Skin is warm and dry. No rash noted.   Cardiovascular: Normal heart rate noted  Respiratory: Normal respiratory effort, no problems with respiration noted  Abdomen: Soft, gravid, appropriate for gestational age. Pain/Pressure: Absent     Pelvic:  Cervical exam deferred        Extremities: Normal range of motion.     ental Status: Normal mood and affect. Normal behavior. Normal judgment and thought content.     Assessment   29 y.o. G1P0 at [redacted]w[redacted]d by  12/24/2019, by Last Menstrual Period presenting for routine prenatal visit  Plan   FIRST Problems (from 05/18/19 to present)     Problem Noted Resolved   Supervision of high risk pregnancy in first trimester 05/20/2019 by Farrel Conners, CNM No   Overview Addendum 07/27/2019  4:51 PM by Natale Milch, MD    [facility] Prenatal Labs  Dating EDD by LMP c/w 10w u/s Blood type: O/Positive/-- (03/26 1057)   Genetic Screen  NIPS:normal xx Antibody:Negative (03/26 1057)  Anatomic [place] complete Rubella: 6.08 (03/26 1057) Varicella: immune  GTT Early:               Third trimester: 116 RPR: Non Reactive (03/26 1057)   Rhogam  not needed HBsAg: Negative (03/26 1057)   TDaP vaccine                       Flu Shot: HIV: Non Reactive (03/26 1057)   Baby Food                                GBS:   Contraception  Pap: NIL 2021  CBB     CS/VBAC    Support Person           Previous Version       Gestational age appropriate obstetric precautions including but not limited to vaginal bleeding, contractions, leaking of fluid and fetal movement were reviewed in detail with the patient.    1) 28 week labs ordered for next visit  2) Obesity BMI>40 - Growth scan next visit  Return in about 1 week (around 10/04/2019) for ROB, growth scan, and 28 week labs.  Vena Austria, MD, Evern Core [facility], [facility] 09/27/2019, 9:24 AM

## 2019-10-05 ENCOUNTER — Ambulatory Visit (INDEPENDENT_AMBULATORY_CARE_PROVIDER_SITE_OTHER): Payer: Medicaid Other | Admitting: Obstetrics and Gynecology

## 2019-10-05 ENCOUNTER — Other Ambulatory Visit: Payer: Self-pay

## 2019-10-05 ENCOUNTER — Other Ambulatory Visit: Payer: Medicaid Other

## 2019-10-05 ENCOUNTER — Encounter: Payer: Medicaid Other | Admitting: Obstetrics and Gynecology

## 2019-10-05 ENCOUNTER — Ambulatory Visit: Payer: Medicaid Other

## 2019-10-05 ENCOUNTER — Encounter: Payer: Self-pay | Admitting: Obstetrics and Gynecology

## 2019-10-05 VITALS — BP 118/74 | Wt 270.0 lb

## 2019-10-05 DIAGNOSIS — Z3A28 28 weeks gestation of pregnancy: Secondary | ICD-10-CM

## 2019-10-05 DIAGNOSIS — O0993 Supervision of high risk pregnancy, unspecified, third trimester: Secondary | ICD-10-CM

## 2019-10-05 DIAGNOSIS — O0991 Supervision of high risk pregnancy, unspecified, first trimester: Secondary | ICD-10-CM

## 2019-10-05 DIAGNOSIS — O99210 Obesity complicating pregnancy, unspecified trimester: Secondary | ICD-10-CM

## 2019-10-05 DIAGNOSIS — O99213 Obesity complicating pregnancy, third trimester: Secondary | ICD-10-CM

## 2019-10-05 DIAGNOSIS — A6004 Herpesviral vulvovaginitis: Secondary | ICD-10-CM

## 2019-10-05 NOTE — Progress Notes (Signed)
Routine Prenatal Care Visit  Subjective  Kelly Baxter is a 29 y.o. G1P0 at [redacted]w[redacted]d being seen today for ongoing prenatal care.  She is currently monitored for the following issues for this high-risk pregnancy and has Supervision of high risk pregnancy in first trimester; Obesity in pregnancy; Adult BMI 40.0-44.9 kg/sq m (HCC); Genital herpes simplex; and Anxiety, generalized on their problem list.  ----------------------------------------------------------------------------------- Patient reports no complaints.   Contractions: Not present. Vag. Bleeding: None.  Movement: Present. Leaking Fluid denies.  ----------------------------------------------------------------------------------- The following portions of the patient's history were reviewed and updated as appropriate: allergies, current medications, past family history, past medical history, past social history, past surgical history and problem list. Problem list updated.  Objective  Blood pressure 118/74, weight 270 lb (122.5 kg), last menstrual period 03/19/2019. Pregravid weight 255 lb (115.7 kg) Total Weight Gain 15 lb (6.804 kg) Urinalysis: Urine Protein    Urine Glucose    Fetal Status: Fetal Heart Rate (bpm): 140   Movement: Present     General:  Alert, oriented and cooperative. Patient is in no acute distress.  Skin: Skin is warm and dry. No rash noted.   Cardiovascular: Normal heart rate noted  Respiratory: Normal respiratory effort, no problems with respiration noted  Abdomen: Soft, gravid, appropriate for gestational age. Pain/Pressure: Absent     Pelvic:  Cervical exam deferred        Extremities: Normal range of motion.     Mental Status: Normal mood and affect. Normal behavior. Normal judgment and thought content.   Bedside Growth U/S today by me (For report see Media tab): Single living intrauterine pregnancy in variable presentation FHR: 140 BPM Placenta: fundal AFI: subjectively normal Growth:  68th%ile, AC  81st %ile.  Assessment   29 y.o. G1P0 at [redacted]w[redacted]d by  12/24/2019, by Last Menstrual Period presenting for routine prenatal visit  Plan   FIRST Problems (from 05/18/19 to present)    Problem Noted Resolved   Supervision of high risk pregnancy in first trimester 05/20/2019 by Farrel Conners, CNM No   Overview Addendum 09/27/2019  9:28 AM by Vena Austria, MD    Clinic [facility] Prenatal Labs  Dating EDD by LMP c/w 10w u/s Blood type: O/Positive/-- (03/26 1057)   Genetic Screen  NIPS:normal xx Antibody:Negative (03/26 1057)  Anatomic [place] complete Rubella: 6.08 (03/26 1057) Varicella: immune  GTT Early:116  Third trimester:  RPR: Non Reactive (03/26 1057)   Rhogam  not needed HBsAg: Negative (03/26 1057)   TDaP vaccine                       Flu Shot: HIV: Non Reactive (03/26 1057)   Baby Food                                GBS:   Contraception  Pap: NIL 2021  CBB     CS/VBAC    Support Person           Previous Version   Obesity in pregnancy 05/20/2019 by Farrel Conners, CNM No   Overview Signed 10/05/2019  5:12 PM by Conard Novak, MD    BMI >=40 [x]  early 1h gtt -  [ ]  screen sleep apnea [x]  u/s for dating  [ ]  nutritional goals [ ]  folic acid 1mg  [ ]  bASA (>12 weeks) [ ]  consider nutrition consult [ ]  consider maternal EKG 1st trimester [ ]  Growth u/s  28 [x] , 32 [ ] , 36 weeks [ ]  [ ]  NST/AFI weekly 34+ weeks (34[] ,35[] ,36[] , 37[] , 38[] , 39[] , 40[] ) [ ]  IOL by 41 weeks (scheduled, prn [] )       Genital herpes simplex 05/20/2019 by , CNM No   Overview Signed 10/05/2019  5:12 PM by , MD    Prophylaxis at 36 weeks          Preterm labor symptoms and general obstetric precautions including but not limited to vaginal bleeding, contractions, leaking of fluid and fetal movement were reviewed in detail with the patient. Please refer to After Visit Summary for other counseling recommendations.   - 28 week labs today - growth u/s  normal  Return in about 2 weeks (around 10/19/2019) for Routine Prenatal Appointment.  , MD, 05/22/2019 OB/GYN, [facility] 10/05/2019 5:10 PM

## 2019-10-06 LAB — 28 WEEK RH+PANEL
Basophils Absolute: 0 10*3/uL (ref 0.0–0.2)
Basos: 0 %
EOS (ABSOLUTE): 0.2 10*3/uL (ref 0.0–0.4)
Eos: 2 %
Gestational Diabetes Screen: 155 mg/dL — ABNORMAL HIGH (ref 65–139)
HIV Screen 4th Generation wRfx: NONREACTIVE
Hematocrit: 32.8 % — ABNORMAL LOW (ref 34.0–46.6)
Hemoglobin: 11.4 g/dL (ref 11.1–15.9)
Immature Grans (Abs): 0.1 10*3/uL (ref 0.0–0.1)
Immature Granulocytes: 1 %
Lymphocytes Absolute: 1.8 10*3/uL (ref 0.7–3.1)
Lymphs: 14 %
MCH: 30.5 pg (ref 26.6–33.0)
MCHC: 34.8 g/dL (ref 31.5–35.7)
MCV: 88 fL (ref 79–97)
Monocytes Absolute: 1 10*3/uL — ABNORMAL HIGH (ref 0.1–0.9)
Monocytes: 8 %
Neutrophils Absolute: 9.6 10*3/uL — ABNORMAL HIGH (ref 1.4–7.0)
Neutrophils: 75 %
Platelets: 236 10*3/uL (ref 150–450)
RBC: 3.74 x10E6/uL — ABNORMAL LOW (ref 3.77–5.28)
RDW: 11.9 % (ref 11.7–15.4)
RPR Ser Ql: NONREACTIVE
WBC: 12.8 10*3/uL — ABNORMAL HIGH (ref 3.4–10.8)

## 2019-10-15 ENCOUNTER — Telehealth: Payer: Self-pay

## 2019-10-15 NOTE — Telephone Encounter (Signed)
LMVM to notify Carpel Tunnel Syndrome is a common symptom in pregnancy. Can try OTC wrist splints. Advised to return call with any further questions/concerns.

## 2019-10-15 NOTE — Telephone Encounter (Signed)
Patient reports having cramps in back of hands to elbows. Cb#[phone]

## 2019-10-18 ENCOUNTER — Other Ambulatory Visit: Payer: Self-pay

## 2019-10-18 ENCOUNTER — Ambulatory Visit (INDEPENDENT_AMBULATORY_CARE_PROVIDER_SITE_OTHER): Payer: Medicaid Other | Admitting: Certified Nurse Midwife

## 2019-10-18 VITALS — BP 120/74 | Wt 273.0 lb

## 2019-10-18 DIAGNOSIS — O99210 Obesity complicating pregnancy, unspecified trimester: Secondary | ICD-10-CM

## 2019-10-18 DIAGNOSIS — Z3A30 30 weeks gestation of pregnancy: Secondary | ICD-10-CM | POA: Diagnosis not present

## 2019-10-18 DIAGNOSIS — Z6841 Body Mass Index (BMI) 40.0 and over, adult: Secondary | ICD-10-CM

## 2019-10-18 DIAGNOSIS — O99213 Obesity complicating pregnancy, third trimester: Secondary | ICD-10-CM

## 2019-10-18 DIAGNOSIS — Z23 Encounter for immunization: Secondary | ICD-10-CM

## 2019-10-18 DIAGNOSIS — R7309 Other abnormal glucose: Secondary | ICD-10-CM

## 2019-10-18 DIAGNOSIS — O0993 Supervision of high risk pregnancy, unspecified, third trimester: Secondary | ICD-10-CM

## 2019-10-18 LAB — POCT URINALYSIS DIPSTICK OB
Glucose, UA: NEGATIVE
POC,PROTEIN,UA: NEGATIVE

## 2019-10-18 NOTE — Progress Notes (Signed)
C/o wrist pain.

## 2019-10-18 NOTE — Progress Notes (Signed)
ROB at 30wk3d: Feeling well. Baby active.   28 week labs: 1 hr GTT 155, H&H 11.4gm/dl%& 16.9% Weight up 3# to 273# (BMI 45.43 kg/m2), BP 120/74. Trace non pitting edema in ankles/feet. Negative proteinuria FHT 143, FH 33cm  A: IUP at 30wk3d with S>D Obesity in pregnany Elevated 1 hour GTT  P: 3 hour GTT ordered ASAP ROB and growth scan in 2 weeks ? May need anesthesia consult at 34 weeks due to BMI>45 TDAP today after discussion Signed BT consent  Farrel Conners, CNM

## 2019-10-26 ENCOUNTER — Telehealth: Payer: Self-pay

## 2019-10-26 NOTE — Telephone Encounter (Signed)
Pt calling; has swelling in both feet and ankles for the last 3-4 days c no relief; elevating doesn't seem to help.  [phone] Adv pt to be sure to drink 64oz of water a day; watch salt intake - fresh food is best then frozen then canned; if has to use canned food to rinse it a few times before preparing it; wear good supportive tennis shoes; elevate as much as possible.

## 2019-10-30 ENCOUNTER — Other Ambulatory Visit: Payer: Medicaid Other

## 2019-10-30 ENCOUNTER — Ambulatory Visit (INDEPENDENT_AMBULATORY_CARE_PROVIDER_SITE_OTHER): Payer: Medicaid Other | Admitting: Obstetrics and Gynecology

## 2019-10-30 ENCOUNTER — Ambulatory Visit (INDEPENDENT_AMBULATORY_CARE_PROVIDER_SITE_OTHER): Payer: Medicaid Other

## 2019-10-30 ENCOUNTER — Other Ambulatory Visit: Payer: Self-pay

## 2019-10-30 VITALS — BP 118/66 | Wt 280.0 lb

## 2019-10-30 DIAGNOSIS — O0991 Supervision of high risk pregnancy, unspecified, first trimester: Secondary | ICD-10-CM

## 2019-10-30 DIAGNOSIS — O0993 Supervision of high risk pregnancy, unspecified, third trimester: Secondary | ICD-10-CM

## 2019-10-30 DIAGNOSIS — R7309 Other abnormal glucose: Secondary | ICD-10-CM

## 2019-10-30 DIAGNOSIS — O99210 Obesity complicating pregnancy, unspecified trimester: Secondary | ICD-10-CM

## 2019-10-30 DIAGNOSIS — O403XX0 Polyhydramnios, third trimester, not applicable or unspecified: Secondary | ICD-10-CM | POA: Insufficient documentation

## 2019-10-30 DIAGNOSIS — Z3A34 34 weeks gestation of pregnancy: Secondary | ICD-10-CM

## 2019-10-30 DIAGNOSIS — O99213 Obesity complicating pregnancy, third trimester: Secondary | ICD-10-CM | POA: Diagnosis not present

## 2019-10-30 DIAGNOSIS — Z3A32 32 weeks gestation of pregnancy: Secondary | ICD-10-CM | POA: Diagnosis not present

## 2019-10-30 LAB — POCT URINALYSIS DIPSTICK OB: Glucose, UA: NEGATIVE

## 2019-10-30 NOTE — Progress Notes (Signed)
ROB Possible Carpal Tunnel in (L) wrist 3 hour GTT

## 2019-10-30 NOTE — Progress Notes (Signed)
Routine Prenatal Care Visit  Subjective  Kelly Baxter is a 29 y.o. G1P0 at [redacted]w[redacted]d being seen today for ongoing prenatal care.  She is currently monitored for the following issues for this high-risk pregnancy and has Supervision of high risk pregnancy in first trimester; Obesity in pregnancy; Adult BMI 40.0-44.9 kg/sq m (HCC); Genital herpes simplex; Anxiety, generalized; and Polyhydramnios in third trimester on their problem list.  ----------------------------------------------------------------------------------- Patient reports no complaints.   Contractions: Not present. Vag. Bleeding: None.  Movement: Present. Denies leaking of fluid.  ----------------------------------------------------------------------------------- The following portions of the patient's history were reviewed and updated as appropriate: allergies, current medications, past family history, past medical history, past social history, past surgical history and problem list. Problem list updated.   Objective  Blood pressure 118/66, weight 280 lb (127 kg), last menstrual period 03/19/2019. Pregravid weight 255 lb (115.7 kg) Total Weight Gain 25 lb (11.3 kg) Urinalysis:      Fetal Status: Fetal Heart Rate (bpm): 145 Fundal Height: 33 cm Movement: Present  Presentation: Vertex  General:  Alert, oriented and cooperative. Patient is in no acute distress.  Skin: Skin is warm and dry. No rash noted.   Cardiovascular: Normal heart rate noted  Respiratory: Normal respiratory effort, no problems with respiration noted  Abdomen: Soft, gravid, appropriate for gestational age. Pain/Pressure: Absent     Pelvic:  Cervical exam deferred        Extremities: Normal range of motion.     ental Status: Normal mood and affect. Normal behavior. Normal judgment and thought content.   US OB Follow Up  Result Date: 10/30/2019 Patient Name: Kelly Baxter DOB: [birth year] MRN: [id number] ULTRASOUND REPORT Location: [facility] Date of  Service: 10/30/2019 Indications:growth/afi Findings: [place] Jim intrauterine pregnancy is visualized with FHR at 149 BPM. Biometrics give an (U/S) Gestational age of [redacted]w[redacted]d and an (U/S) EDD of 12/08/2019; this correlates with the clinically established Estimated Date of Delivery: 12/24/19. Fetal presentation is Cephalic. Placenta: fundal. Grade: 2 AFI: 25.4 cm Growth percentile is 79.7.  AC percentile is >97.7. EFW: 2443 g  ( 5 lb 6 oz ) Impression: 1. [redacted]w[redacted]d Viable Singleton Intrauterine pregnancy previously established criteria. 2. Growth is 79.7 %ile.  AFI is 25.4 cm. Recommendations: 1.Clinical correlation with the patient's History and Physical Exam. Deanna Artis, RT There is a singleton gestation with normal amniotic fluid volume. The fetal biometry correlates with established dating.  The visualized fetal anatomy appears within normal limits within the resolution of ultrasound as described above.  It must be noted that a normal ultrasound is unable to rule out fetal aneuploidy.  Vena Austria, MD, [facility], [facility] 10/30/2019, 11:12 AM     Assessment   29 y.o. G1P0 at [redacted]w[redacted]d by  12/24/2019, by Last Menstrual Period presenting for routine prenatal visit  Plan   FIRST Problems (from 05/18/19 to present)    Problem Noted Resolved   Supervision of high risk pregnancy in first trimester 05/20/2019 by Farrel Conners, CNM No   Overview Addendum 10/18/2019 10:40 PM by Farrel Conners, CNM    Clinic [facility] Prenatal Labs  Dating EDD by LMP c/w 10w u/s Blood type: O/Positive/-- (03/26 1057)   Genetic Screen  NIPS:normal xx Antibody:Negative (03/26 1057)  Anatomic [place] complete Rubella: 6.08 (03/26 1057) Varicella: immune  GTT Early:116  Third trimester: 155 RPR: Non Reactive (03/26 1057)   Rhogam  not needed HBsAg: Negative (03/26 1057)   TDaP vaccine  8/26                Flu Shot: HIV: Non Reactive (03/26 1057)   Baby Food                                GBS:     Contraception  Pap: NIL 2021  CBB     CS/VBAC    Support Person           Previous Version   Obesity in pregnancy 05/20/2019 by Farrel Conners, CNM No   Overview Signed 10/05/2019  5:12 PM by Conard Novak, MD    BMI >=40 [ ]  early 1h gtt -  [ ]  screen sleep apnea [ ]  u/s for dating [ ]   [ ]  nutritional goals [ ]  folic acid 1mg  [ ]  bASA (>12 weeks) [ ]  consider nutrition consult [ ]  consider maternal EKG 1st trimester [ ]  Growth u/s 28 [ ] , 32 [ ] , 36 weeks [ ]  [ ]  NST/AFI weekly 34+ weeks (34[] ,35[] ,36[] , 37[] , 38[] , 39[] , 40[] ) [ ]  IOL by 41 weeks (scheduled, prn [] )       Genital herpes simplex 05/20/2019 by , CNM No   Overview Signed 10/05/2019  5:12 PM by , MD    Prophylaxis at 110 weeks          Gestational age appropriate obstetric precautions including but not limited to vaginal bleeding, contractions, leaking of fluid and fetal movement were reviewed in detail with the patient.    -mild polyhydramnios noted on today's ultrasound - 3-hr OGTT  Return in about 2 weeks (around 11/13/2019) for ROB and AFI.  , MD, [facility], [facility] 10/30/2019, 11:27 AM

## 2019-10-31 LAB — GESTATIONAL GLUCOSE TOLERANCE
Glucose, Fasting: 72 mg/dL (ref 65–94)
Glucose, GTT - 1 Hour: 120 mg/dL (ref 65–179)
Glucose, GTT - 2 Hour: 107 mg/dL (ref 65–154)
Glucose, GTT - 3 Hour: 88 mg/dL (ref 65–139)

## 2019-11-15 ENCOUNTER — Encounter: Payer: Self-pay | Admitting: Obstetrics & Gynecology

## 2019-11-15 ENCOUNTER — Other Ambulatory Visit: Payer: Self-pay | Admitting: Obstetrics and Gynecology

## 2019-11-15 ENCOUNTER — Other Ambulatory Visit: Payer: Self-pay

## 2019-11-15 ENCOUNTER — Ambulatory Visit (INDEPENDENT_AMBULATORY_CARE_PROVIDER_SITE_OTHER): Payer: Medicaid Other

## 2019-11-15 ENCOUNTER — Encounter: Payer: Self-pay | Admitting: Obstetrics and Gynecology

## 2019-11-15 ENCOUNTER — Ambulatory Visit (INDEPENDENT_AMBULATORY_CARE_PROVIDER_SITE_OTHER): Payer: Medicaid Other | Admitting: Obstetrics & Gynecology

## 2019-11-15 ENCOUNTER — Observation Stay
Admission: EM | Admit: 2019-11-15 | Discharge: 2019-11-15 | Disposition: A | Discharge status: Home / Self Care | Payer: Medicaid Other | Attending: Obstetrics and Gynecology | Admitting: Obstetrics and Gynecology

## 2019-11-15 VITALS — BP 160/100 | Wt 297.0 lb

## 2019-11-15 DIAGNOSIS — O0991 Supervision of high risk pregnancy, unspecified, first trimester: Secondary | ICD-10-CM | POA: Diagnosis not present

## 2019-11-15 DIAGNOSIS — Z3A34 34 weeks gestation of pregnancy: Secondary | ICD-10-CM

## 2019-11-15 DIAGNOSIS — A6004 Herpesviral vulvovaginitis: Secondary | ICD-10-CM

## 2019-11-15 DIAGNOSIS — O163 Unspecified maternal hypertension, third trimester: Secondary | ICD-10-CM | POA: Diagnosis present

## 2019-11-15 DIAGNOSIS — O99210 Obesity complicating pregnancy, unspecified trimester: Secondary | ICD-10-CM

## 2019-11-15 DIAGNOSIS — E669 Obesity, unspecified: Secondary | ICD-10-CM

## 2019-11-15 DIAGNOSIS — O288 Other abnormal findings on antenatal screening of mother: Secondary | ICD-10-CM

## 2019-11-15 DIAGNOSIS — O99810 Abnormal glucose complicating pregnancy: Secondary | ICD-10-CM | POA: Diagnosis not present

## 2019-11-15 DIAGNOSIS — O1403 Mild to moderate pre-eclampsia, third trimester: Secondary | ICD-10-CM

## 2019-11-15 DIAGNOSIS — O98313 Other infections with a predominantly sexual mode of transmission complicating pregnancy, third trimester: Secondary | ICD-10-CM | POA: Insufficient documentation

## 2019-11-15 DIAGNOSIS — O403XX0 Polyhydramnios, third trimester, not applicable or unspecified: Secondary | ICD-10-CM | POA: Diagnosis not present

## 2019-11-15 DIAGNOSIS — O99213 Obesity complicating pregnancy, third trimester: Secondary | ICD-10-CM | POA: Diagnosis not present

## 2019-11-15 DIAGNOSIS — O133 Gestational [pregnancy-induced] hypertension without significant proteinuria, third trimester: Principal | ICD-10-CM | POA: Insufficient documentation

## 2019-11-15 DIAGNOSIS — O0993 Supervision of high risk pregnancy, unspecified, third trimester: Secondary | ICD-10-CM

## 2019-11-15 LAB — CBC
HCT: 32.1 % — ABNORMAL LOW (ref 36.0–46.0)
Hemoglobin: 10.4 g/dL — ABNORMAL LOW (ref 12.0–15.0)
MCH: 28 pg (ref 26.0–34.0)
MCHC: 32.4 g/dL (ref 30.0–36.0)
MCV: 86.5 fL (ref 80.0–100.0)
Platelets: 206 10*3/uL (ref 150–400)
RBC: 3.71 MIL/uL — ABNORMAL LOW (ref 3.87–5.11)
RDW: 13.2 % (ref 11.5–15.5)
WBC: 12.4 10*3/uL — ABNORMAL HIGH (ref 4.0–10.5)
nRBC: 0 % (ref 0.0–0.2)

## 2019-11-15 LAB — COMPREHENSIVE METABOLIC PANEL
ALT: 18 U/L (ref 0–44)
AST: 18 U/L (ref 15–41)
Albumin: 2.5 g/dL — ABNORMAL LOW (ref 3.5–5.0)
Alkaline Phosphatase: 104 U/L (ref 38–126)
Anion gap: 7 (ref 5–15)
BUN: 8 mg/dL (ref 6–20)
CO2: 24 mmol/L (ref 22–32)
Calcium: 8.2 mg/dL — ABNORMAL LOW (ref 8.9–10.3)
Chloride: 104 mmol/L (ref 98–111)
Creatinine, Ser: 0.77 mg/dL (ref 0.44–1.00)
GFR calc Af Amer: >60 mL/min (ref >60)
GFR calc non Af Amer: >60 mL/min (ref >60)
Glucose, Bld: 95 mg/dL (ref 70–99)
Potassium: 4 mmol/L (ref 3.5–5.1)
Sodium: 135 mmol/L (ref 135–145)
Total Bilirubin: 0.4 mg/dL (ref 0.3–1.2)
Total Protein: 5.8 g/dL — ABNORMAL LOW (ref 6.5–8.1)

## 2019-11-15 LAB — URINE DRUG SCREEN, QUALITATIVE (ARMC ONLY)
Amphetamines, Ur Screen: NOT DETECTED
Barbiturates, Ur Screen: NOT DETECTED
Benzodiazepine, Ur Scrn: NOT DETECTED
Cannabinoid 50 Ng, Ur ~~LOC~~: NOT DETECTED
Cocaine Metabolite,Ur ~~LOC~~: NOT DETECTED
MDMA (Ecstasy)Ur Screen: NOT DETECTED
Methadone Scn, Ur: NOT DETECTED
Opiate, Ur Screen: NOT DETECTED
Phencyclidine (PCP) Ur S: NOT DETECTED
Tricyclic, Ur Screen: NOT DETECTED

## 2019-11-15 LAB — PROTEIN / CREATININE RATIO, URINE
Creatinine, Urine: 93 mg/dL
Protein Creatinine Ratio: 0.12 mg/mg{Cre} (ref 0.00–0.15)
Total Protein, Urine: 11 mg/dL

## 2019-11-15 MED ORDER — LIDOCAINE HCL (PF) 1 % IJ SOLN
INTRAMUSCULAR | Status: AC
  Filled 2019-11-15: qty 30

## 2019-11-15 NOTE — Discharge Summary (Addendum)
Physician Discharge Summary  Patient ID: Kelly Baxter MRN: [id number] DOB/AGE: Aug 21, 1990 29 y.o.  Admit date: 11/15/2019 Discharge date: 11/15/2019  Admission Diagnoses:   Elevated blood pressure affecting pregnancy in third trimester, antepartum   Discharge Diagnoses:  Active Problems:   Elevated blood pressure affecting pregnancy in third trimester, antepartum   Discharged Condition: good  Hospital Course: 29 y.o. G1P0 at [redacted]w[redacted]d by Estimated Date of Delivery: 12/24/19 presenting from the office for elevated blood pressures.  Initial BP 140/90 with repeat BP of 160/100.  No headaches, vision changes, RUQ or epigastric pain.  Has noted some increased lower extremity edema.  +FM, no LOF, no VB, no contractions.  7lbs weight gain in the past week.  Pregnancy has been complicated by obesity, THC positive UDS, elevated 1-hr glucose test of 155 with normal 3-hr glucose test.  On presentation to L&D blood pressures were noted to be normotensive.  Laboratory evaluation was negative UDS, P/C ratio, CBC, and CMP.  Patient has repeat clinic BP set up for Monday 11/19/2019.   AFI today today in clinic revealed mild polyhydramniosof 26.8cm.  Growth scan 9/7 revealed EFW of 2443 g  ( 5 lb 6 oz ) and AFI of 25.4cm.  FIRST Problems (from 05/18/19 to present)    Problem Noted Resolved   Polyhydramnios in third trimester 10/30/2019 by Vena Austria, MD No   Supervision of high risk pregnancy in first trimester 05/20/2019 by Farrel Conners, CNM No   Overview Addendum 11/01/2019  9:05 AM by Vena Austria, MD    Clinic [facility] Prenatal Labs  Dating EDD by LMP c/w 10w u/s Blood type: O/Positive/-- (03/26 1057)   Genetic Screen  NIPS:normal xx Antibody:Negative (03/26 1057)  Anatomic [place] complete Rubella: 6.08 (03/26 1057) Varicella: immune  GTT Early:116  Third trimester: 155 3-hr 72 / 120 / 107 / 88 RPR: Non Reactive (03/26 1057)   Rhogam  not needed HBsAg: Negative (03/26 1057)   TDaP vaccine        8/26                Flu Shot: HIV: Non Reactive (03/26 1057)   Baby Food                                GBS:   Contraception  Pap: NIL 2021  CBB     CS/VBAC    Support Person           Previous Version   Obesity in pregnancy 05/20/2019 by Farrel Conners, CNM No   Overview Signed 10/05/2019  5:12 PM by Conard Novak, MD    BMI >=40 [ ]  early 1h gtt -  [ ]  screen sleep apnea [ ]  u/s for dating [ ]   [ ]  nutritional goals [ ]  folic acid 1mg  [ ]  bASA (>12 weeks) [ ]  consider nutrition consult [ ]  consider maternal EKG 1st trimester [ ]  Growth u/s 28 [ ] , 32 [ ] , 36 weeks [ ]  [ ]  NST/AFI weekly 34+ weeks (34[] ,35[] ,36[] , 37[] , 38[] , 39[] , 40[] ) [ ]  IOL by 41 weeks (scheduled, prn [] )       Genital herpes simplex 05/20/2019 by , CNM No   Overview Signed 10/05/2019  5:12 PM by , MD    Prophylaxis at 36 weeks          Consults: None  Significant Diagnostic Studies:  Results for orders placed or performed during  the hospital encounter of 11/15/19 (from the past 24 hour(s))  Protein / creatinine ratio, urine     Status: None   Collection Time: 11/15/19  5:38 PM  Result Value Ref Range   Creatinine, Urine 93 mg/dL   Total Protein, Urine 11 mg/dL   Protein Creatinine Ratio 0.12 0.00 - 0.15 mg/mg[Cre]  Urine Drug Screen, Qualitative ([facility] only)     Status: None   Collection Time: 11/15/19  5:38 PM  Result Value Ref Range   Tricyclic, Ur Screen NONE DETECTED NONE DETECTED   Amphetamines, Ur Screen NONE DETECTED NONE DETECTED   MDMA (Ecstasy)Ur Screen NONE DETECTED NONE DETECTED   Cocaine Metabolite,Ur [place] NONE DETECTED NONE DETECTED   Opiate, Ur Screen NONE DETECTED NONE DETECTED   Phencyclidine (PCP) Ur S NONE DETECTED NONE DETECTED   Cannabinoid 50 Ng, Ur [place] NONE DETECTED NONE DETECTED   Barbiturates, Ur Screen NONE DETECTED NONE DETECTED   Benzodiazepine, Ur Scrn NONE DETECTED NONE DETECTED   Methadone Scn, Ur NONE DETECTED  NONE DETECTED  Comprehensive metabolic panel     Status: Abnormal   Collection Time: 11/15/19  6:06 PM  Result Value Ref Range   Sodium 135 135 - 145 mmol/L   Potassium 4.0 3.5 - 5.1 mmol/L   Chloride 104 98 - 111 mmol/L   CO2 24 22 - 32 mmol/L   Glucose, Bld 95 70 - 99 mg/dL   BUN 8 6 - 20 mg/dL   Creatinine, Ser 4.49 0.44 - 1.00 mg/dL   Calcium 8.2 (L) 8.9 - 10.3 mg/dL   Total Protein 5.8 (L) 6.5 - 8.1 g/dL   Albumin 2.5 (L) 3.5 - 5.0 g/dL   AST 18 15 - 41 U/L   ALT 18 0 - 44 U/L   Alkaline Phosphatase 104 38 - 126 U/L   Total Bilirubin 0.4 0.3 - 1.2 mg/dL   GFR calc non Af Amer >60 >60 mL/min   GFR calc Af Amer >60 >60 mL/min   Anion gap 7 5 - 15  CBC     Status: Abnormal   Collection Time: 11/15/19  6:06 PM  Result Value Ref Range   WBC 12.4 (H) 4.0 - 10.5 K/uL   RBC 3.71 (L) 3.87 - 5.11 MIL/uL   Hemoglobin 10.4 (L) 12.0 - 15.0 g/dL   HCT 67.5 (L) 36 - 46 %   MCV 86.5 80.0 - 100.0 fL   MCH 28.0 26.0 - 34.0 pg   MCHC 32.4 30.0 - 36.0 g/dL   RDW 91.6 38.4 - 66.5 %   Platelets 206 150 - 400 K/uL   nRBC 0.0 0.0 - 0.2 %     Treatments: none  Discharge Exam: Blood pressure 127/82, pulse 93, temperature 98.2 F (36.8 C), temperature source Oral, resp. rate 18, height 5\' 5"  (1.651 m), weight 131.5 kg, last menstrual period 03/19/2019.  Temp:  [98.2 F (36.8 C)] 98.2 F (36.8 C) (09/23 1735) Pulse Rate:  [93] 93 (09/23 1735) Resp:  [18] 18 (09/23 1735) BP: (127-160)/(82-100) 127/82 (09/23 1735) Weight:  [131.5 kg-134.7 kg] 131.5 kg (09/23 1735) Only elevated reading were clinic readings  Baseline: 150 Variability: moderate Accelerations: present Decelerations: present Tocometry: irregular The patient was monitored for 30 minutes, fetal heart rate tracing was deemed reactive, category I tracing,  General appearance: alert, appears stated age and no distress Resp: no increased work of breathing GI: gravid, soft, non-tender, non-distended Extremities: trace  bilateral pretibial edema  Disposition: Discharge disposition: 01-Home or Self Care  Discharge Instructions    Discharge activity:  No Restrictions   Complete by: As directed    Discharge diet:  No restrictions   Complete by: As directed    Fetal Kick Count:  Lie on our left side for one hour after a meal, and count the number of times your baby kicks.  If it is less than 5 times, get up, move around and drink some juice.  Repeat the test 30 minutes later.  If it is still less than 5 kicks in an hour, notify your doctor.   Complete by: As directed    LABOR:  When conractions begin, you should start to time them from the beginning of one contraction to the beginning  of the next.  When contractions are 5 - 10 minutes apart or less and have been regular for at least an hour, you should call your health care provider.   Complete by: As directed    No sexual activity restrictions   Complete by: As directed    Notify physician for bleeding from the vagina   Complete by: As directed    Notify physician for blurring of vision or spots before the eyes   Complete by: As directed    Notify physician for chills or fever   Complete by: As directed    Notify physician for fainting spells, "black outs" or loss of consciousness   Complete by: As directed    Notify physician for increase in vaginal discharge   Complete by: As directed    Notify physician for leaking of fluid   Complete by: As directed    Notify physician for pain or burning when urinating   Complete by: As directed    Notify physician for pelvic pressure (sudden increase)   Complete by: As directed    Notify physician for severe or continued nausea or vomiting   Complete by: As directed    Notify physician for sudden gushing of fluid from the vagina (with or without continued leaking)   Complete by: As directed    Notify physician for sudden, constant, or occasional abdominal pain   Complete by: As directed    Notify  physician if baby moving less than usual   Complete by: As directed      Allergies as of 11/15/2019      Reactions   Aluminum Hives, Rash   Aluminum-containing Compounds Hives   Other    Trees, weeds, grass, dust mites.      Medication List    TAKE these medications   acyclovir 400 MG tablet Commonly known as: ZOVIRAX Take 400 mg by mouth as needed.   docusate sodium 100 MG capsule Commonly known as: COLACE Take 100 mg by mouth daily as needed for mild constipation.   fluticasone 50 MCG/ACT nasal spray Commonly known as: FLONASE Place 2 sprays into both nostrils daily. As needed for nasal congestion   multivitamin-prenatal 27-0.8 MG Tabs tablet Take 1 tablet by mouth daily at 12 noon.   pantoprazole 40 MG tablet Commonly known as: PROTONIX Take 40 mg by mouth daily.        Signed: Vena Austria 11/15/2019, 7:05 PM

## 2019-11-15 NOTE — OB Triage Note (Signed)
Pt. Presented to L/D triage from the office for [facility] Eval. She reports no headache or blurry vision, no epigastric pain, and lower extremity edema (1+,2+) that began last week.+2 reflexes, absent clonus.  Pt reports no pain and positive fetal movement. No bleeding or LOF. Monitors applied and assessing. Last BP 127/82, cycling q75min.

## 2019-11-15 NOTE — Progress Notes (Signed)
  Subjective  Fetal Movement? yes Contractions? no Leaking Fluid? no Vaginal Bleeding? no Pt c/o edema in hands and feet, worse this past week, carpal tunnel-like sx's as well    Denies headache, blurry vision, CP, SOB, epigastric pain    Home BP readings are 130-140/80-90s the past 3 days  Objective  BP (!) 142/90   Wt 297 lb (134.7 kg)   LMP 03/19/2019   BMI 49.42 kg/m     REPEAT BP 160/100 General: NAD Pumonary: no increased work of breathing Abdomen: gravid, non-tender Extremities: no edema Psychiatric: mood appropriate, affect full  Review of ULTRASOUND.    I have personally reviewed images and report of recent ultrasound done at [facility].    Plan of management to be discussed with patient.    AFI 27, VTX  Assessment  29 y.o. G1P0 at [redacted]w[redacted]d by  12/24/2019, by Last Menstrual Period presenting for routine prenatal visit  Plan   Problem List Items Addressed This Visit      Cardiovascular and Mediastinum   Hypertension during pregnancy in third trimester     Other   Supervision of high risk pregnancy in first trimester   Obesity in pregnancy    Other Visit Diagnoses    [redacted] weeks gestation of pregnancy    -  Primary      Previous Version   Obesity in pregnancy 05/20/2019 by Farrel Conners, CNM No         Genital herpes simplex 05/20/2019 by Farrel Conners, CNM No   Overview Signed 10/05/2019  5:12 PM by Conard Novak, MD    Prophylaxis at 36 weeks        Due to elevated BP and edema today, she is going to L&D for further PIH and preeclampsia evaluation       Risks of these disorders on pregnancy and fetal well being discussed  BP check arranged next Monday if she is being treated as outpatient  PNV, [facility]  PTL precautions  Annamarie Major, MD, [facility], [facility] 11/15/2019  4:36 PM

## 2019-11-19 ENCOUNTER — Encounter: Payer: Self-pay | Admitting: Obstetrics & Gynecology

## 2019-11-19 ENCOUNTER — Ambulatory Visit (INDEPENDENT_AMBULATORY_CARE_PROVIDER_SITE_OTHER): Payer: Medicaid Other | Admitting: Obstetrics & Gynecology

## 2019-11-19 ENCOUNTER — Other Ambulatory Visit: Payer: Self-pay

## 2019-11-19 VITALS — BP 140/90 | Wt 296.0 lb

## 2019-11-19 DIAGNOSIS — Z3A35 35 weeks gestation of pregnancy: Secondary | ICD-10-CM

## 2019-11-19 DIAGNOSIS — O0993 Supervision of high risk pregnancy, unspecified, third trimester: Secondary | ICD-10-CM

## 2019-11-19 DIAGNOSIS — O99210 Obesity complicating pregnancy, unspecified trimester: Secondary | ICD-10-CM

## 2019-11-19 LAB — POCT URINALYSIS DIPSTICK OB
Glucose, UA: NEGATIVE
POC,PROTEIN,UA: NEGATIVE

## 2019-11-19 NOTE — Patient Instructions (Signed)

## 2019-11-19 NOTE — Addendum Note (Signed)
Addended by: Cornelius Moras D on: 11/19/2019 04:45 PM   Modules accepted: Orders

## 2019-11-19 NOTE — Progress Notes (Signed)
  Subjective  Fetal Movement? yes Contractions? no Leaking Fluid? no Vaginal Bleeding? no Denies h/a, blurry vision, CP, SOB, epigastric pain.  Edema still present. Seen last Thurs for PIH w/u, neg.  BP normalized w bed rest in hospital.  Have been up and down at home.  She has gone back to work.  Objective  BP 140/90   Wt 296 lb (134.3 kg)   LMP 03/19/2019   BMI 49.26 kg/m  General: NAD Pumonary: no increased work of breathing Abdomen: gravid, non-tender Extremities: no edema Psychiatric: mood appropriate, affect full  Assessment  29 y.o. G1P0 at [redacted]w[redacted]d by  12/24/2019, by Last Menstrual Period presenting for routine prenatal visit  Plan   Problem List Items Addressed This Visit      Other   Supervision of high risk pregnancy in first trimester   Obesity in pregnancy    Other Visit Diagnoses    [redacted] weeks gestation of pregnancy    -  Primary      FIRST Problems (from 05/18/19 to present)    Problem Noted Resolved   Polyhydramnios in third trimester 10/30/2019 by Vena Austria, MD No   Supervision of high risk pregnancy in first trimester 05/20/2019 by Farrel Conners, CNM No   Overview Addendum 11/01/2019  9:05 AM by Vena Austria, MD    [facility] Prenatal Labs  Dating EDD by LMP c/w 10w u/s Blood type: O/Positive/-- (03/26 1057)   Genetic Screen  NIPS:normal xx Antibody:Negative (03/26 1057)  Anatomic [place] complete Rubella: 6.08 (03/26 1057) Varicella: immune  GTT Early:116  Third trimester: 155 3-hr 72 / 120 / 107 / 88 RPR: Non Reactive (03/26 1057)   Rhogam  not needed HBsAg: Negative (03/26 1057)   TDaP vaccine       8/26                Flu Shot: HIV: Non Reactive (03/26 1057)   Baby Food                                GBS:   Contraception  Pap: NIL 2021  CBB     CS/VBAC    Support Person           Previous Version   Obesity in pregnancy 05/20/2019 by Farrel Conners, CNM No   Overview Signed 10/05/2019  5:12 PM by Conard Novak, MD    BMI  >=40 [ ]  early 1h gtt -  [ ]  screen sleep apnea [ ]  u/s for dating [ ]   [ ]  nutritional goals [ ]  folic acid 1mg  [ ]  bASA (>12 weeks) [ ]  consider nutrition consult [ ]  consider maternal EKG 1st trimester [ ]  Growth u/s 28 [ ] , 32 [ ] , 36 weeks [ ]  [ ]  NST/AFI weekly 34+ weeks (34[] ,35[] ,36[] , 37[] , 38[] , 39[] , 40[] ) [ ]  IOL by 41 weeks (scheduled, prn [] )       Genital herpes simplex 05/20/2019 by , CNM No   Overview Signed 10/05/2019  5:12 PM by , MD    Prophylaxis at 36 weeks        Cont to monitor BP and for s/sx preeclampsia  PNV, FMC, PTL precautions  GBS nv  , MD, Ob/Gyn, [facility] 11/19/2019  4:42 PM

## 2019-11-26 ENCOUNTER — Other Ambulatory Visit: Payer: Self-pay

## 2019-11-26 ENCOUNTER — Ambulatory Visit (INDEPENDENT_AMBULATORY_CARE_PROVIDER_SITE_OTHER): Payer: Medicaid Other | Admitting: Obstetrics

## 2019-11-26 VITALS — BP 140/90 | Wt 299.0 lb

## 2019-11-26 DIAGNOSIS — A6004 Herpesviral vulvovaginitis: Secondary | ICD-10-CM

## 2019-11-26 DIAGNOSIS — Z3A36 36 weeks gestation of pregnancy: Secondary | ICD-10-CM

## 2019-11-26 DIAGNOSIS — O0993 Supervision of high risk pregnancy, unspecified, third trimester: Secondary | ICD-10-CM

## 2019-11-26 LAB — POCT URINALYSIS DIPSTICK OB
Glucose, UA: NEGATIVE
POC,PROTEIN,UA: NEGATIVE

## 2019-11-26 MED ORDER — VALACYCLOVIR HCL 500 MG PO TABS: 500.0000 mg | ORAL_TABLET | Freq: Two times a day (BID) | ORAL | 6 refills | Status: DC

## 2019-11-26 NOTE — Progress Notes (Signed)
C/o swelling feet/hands; BP. Adv to buy good supportive tennis shoes, drink at least 64oz water, watch salt intake - fresh is best, then frozen , then canned.  Stay away from processed food and meat, [place] meats.   If has to use canned food to rinse it 2-3 times before preparing it.

## 2019-11-26 NOTE — Progress Notes (Signed)
Routine Prenatal Care Visit  Subjective  Kelly Baxter is a 29 y.o. G1P0 at [redacted]w[redacted]d being seen today for ongoing prenatal care.  She is currently monitored for the following issues for this high-risk pregnancy and has Supervision of high risk pregnancy in first trimester; Obesity in pregnancy; Adult BMI 40.0-44.9 kg/sq m (HCC); Genital herpes simplex; Anxiety, generalized; Polyhydramnios in third trimester; Hypertension during pregnancy in third trimester; and Elevated blood pressure affecting pregnancy in third trimester, antepartum on their problem list.  ----------------------------------------------------------------------------------- Patient reports no complaints.  Her baby is moving well. She denies any headaches, blurred vision. Her ankle and pedal edema continues. She has compression stockings, but is not wearing them every day.  .  .   Pincus Large Fluid denies.  ----------------------------------------------------------------------------------- The following portions of the patient's history were reviewed and updated as appropriate: allergies, current medications, past family history, past medical history, past social history, past surgical history and problem list. Problem list updated.  Objective  Blood pressure 140/90, weight 299 lb (135.6 kg), last menstrual period 03/19/2019. Pregravid weight 255 lb (115.7 kg) Total Weight Gain 44 lb (20 kg) Urinalysis: Urine Protein Negative  Urine Glucose Negative  Fetal Status:           General:  Alert, oriented and cooperative. Patient is in no acute distress.  Skin: Skin is warm and dry. No rash noted.   Cardiovascular: Normal heart rate noted  Respiratory: Normal respiratory effort, no problems with respiration noted  Abdomen: Soft, gravid, appropriate for gestational age.       Pelvic:  Cervical exam deferred        Extremities: Normal range of motion.     Mental Status: Normal mood and affect. Normal behavior. Normal judgment and  thought content.   Assessment   29 y.o. G1P0 at [redacted]w[redacted]d by  12/24/2019, by Last Menstrual Period presenting for routine prenatal visit High BMI HTN this pregnancy Hx of HSV- needs to start Valtrex.  Plan   FIRST Problems (from 05/18/19 to present)    Problem Noted Resolved   Polyhydramnios in third trimester 10/30/2019 by Vena Austria, MD No   Supervision of high risk pregnancy in first trimester 05/20/2019 by Farrel Conners, CNM No   Overview Addendum 11/01/2019  9:05 AM by Vena Austria, MD    [facility] Prenatal Labs  Dating EDD by LMP c/w 10w u/s Blood type: O/Positive/-- (03/26 1057)   Genetic Screen  NIPS:normal xx Antibody:Negative (03/26 1057)  Anatomic [place] complete Rubella: 6.08 (03/26 1057) Varicella: immune  GTT Early:116  Third trimester: 155 3-hr 72 / 120 / 107 / 88 RPR: Non Reactive (03/26 1057)   Rhogam  not needed HBsAg: Negative (03/26 1057)   TDaP vaccine       8/26                Flu Shot: HIV: Non Reactive (03/26 1057)   Baby Food                                GBS:   Contraception  Pap: NIL 2021  CBB     CS/VBAC    Support Person           Previous Version   Obesity in pregnancy 05/20/2019 by Farrel Conners, CNM No   Overview Signed 10/05/2019  5:12 PM by Conard Novak, MD    BMI >=40 [ ]  early 1h gtt -  [ ]   screen sleep apnea [ ]  u/s for dating [ ]   [ ]  nutritional goals [ ]  folic acid 1mg  [ ]  bASA (>12 weeks) [ ]  consider nutrition consult [ ]  consider maternal EKG 1st trimester [ ]  Growth u/s 28 [ ] , 32 [ ] , 36 weeks [ ]  [ ]  NST/AFI weekly 34+ weeks (34[] ,35[] ,36[] , 37[] , 38[] , 39[] , 40[] ) [ ]  IOL by 41 weeks (scheduled, prn [] )       Genital herpes simplex 05/20/2019 by , CNM No   Overview Signed 10/05/2019  5:12 PM by , MD    Prophylaxis at 36 weeks          Term labor symptoms and general obstetric precautions including but not limited to vaginal bleeding, contractions, leaking of  fluid and fetal movement were reviewed in detail with the patient. Please refer to After Visit Summary for other counseling recommendations.  NST today. RTC later this week for NST and VS check. Rx for Valtrex sent. GBS culture, GC culture sent.  Return in about 1 week (around 12/03/2019) for return OB and GBS.  , CNM  11/26/2019 4:51 PM

## 2019-11-27 LAB — OB RESULTS CONSOLE GC/CHLAMYDIA: Gonorrhea: NEGATIVE

## 2019-11-29 ENCOUNTER — Encounter: Payer: Self-pay | Admitting: Advanced Practice Midwife

## 2019-11-29 ENCOUNTER — Other Ambulatory Visit: Payer: Self-pay

## 2019-11-29 ENCOUNTER — Ambulatory Visit (INDEPENDENT_AMBULATORY_CARE_PROVIDER_SITE_OTHER): Payer: Medicaid Other | Admitting: Advanced Practice Midwife

## 2019-11-29 VITALS — BP 142/94 | Wt 299.0 lb

## 2019-11-29 DIAGNOSIS — O403XX0 Polyhydramnios, third trimester, not applicable or unspecified: Secondary | ICD-10-CM | POA: Diagnosis not present

## 2019-11-29 DIAGNOSIS — O99210 Obesity complicating pregnancy, unspecified trimester: Secondary | ICD-10-CM

## 2019-11-29 DIAGNOSIS — O99213 Obesity complicating pregnancy, third trimester: Secondary | ICD-10-CM | POA: Diagnosis not present

## 2019-11-29 DIAGNOSIS — O0993 Supervision of high risk pregnancy, unspecified, third trimester: Secondary | ICD-10-CM

## 2019-11-29 DIAGNOSIS — Z3A36 36 weeks gestation of pregnancy: Secondary | ICD-10-CM

## 2019-11-29 LAB — FETAL NONSTRESS TEST

## 2019-11-29 LAB — GC/CHLAMYDIA PROBE AMP
Chlamydia trachomatis, NAA: NEGATIVE
Neisseria Gonorrhoeae by PCR: NEGATIVE

## 2019-11-29 NOTE — Progress Notes (Signed)
Routine Prenatal Care Visit  Subjective  Kelly Baxter is a 29 y.o. G1P0 at [redacted]w[redacted]d being seen today for ongoing prenatal care.  She is currently monitored for the following issues for this high-risk pregnancy and has Supervision of high risk pregnancy in first trimester; Obesity in pregnancy; Adult BMI 40.0-44.9 kg/sq m (HCC); Genital herpes simplex; Anxiety, generalized; Polyhydramnios in third trimester; Hypertension during pregnancy in third trimester; and Elevated blood pressure affecting pregnancy in third trimester, antepartum on their problem list.  ----------------------------------------------------------------------------------- Patient reports no complaints.  We discussed preeclampsia precautions. Labs today and has follow up in a few days. Will add NST and growth to already scheduled visit. She denies headache, visual changes or epigastric pain. Contractions: Not present. Vag. Bleeding: None.  Movement: Present. Leaking Fluid denies.  ----------------------------------------------------------------------------------- The following portions of the patient's history were reviewed and updated as appropriate: allergies, current medications, past family history, past medical history, past social history, past surgical history and problem list. Problem list updated.  Objective  Blood pressure (!) 142/94, weight 299 lb (135.6 kg), last menstrual period 03/19/2019. Pregravid weight 255 lb (115.7 kg) Total Weight Gain 44 lb (20 kg) Urinalysis: Urine Protein    Urine Glucose    Fetal Status: Fetal Heart Rate (bpm): 135   Movement: Present      NST: reactive 20 minute tracing, 135 bpm, moderate variability, +accelerations, -decelerations  General:  Alert, oriented and cooperative. Patient is in no acute distress.  Skin: Skin is warm and dry. No rash noted.   Cardiovascular: Normal heart rate noted  Respiratory: Normal respiratory effort, no problems with respiration noted  Abdomen: Soft,  gravid, appropriate for gestational age. Pain/Pressure: Absent     Pelvic:  Cervical exam deferred        Extremities: Normal range of motion.  Edema: Moderate pitting, indentation subsides rapidly  Mental Status: Normal mood and affect. Normal behavior. Normal judgment and thought content.   Assessment   29 y.o. G1P0 at [redacted]w[redacted]d by  12/24/2019, by Last Menstrual Period presenting for routine prenatal visit  Plan   FIRST Problems (from 05/18/19 to present)    Problem Noted Resolved   Polyhydramnios in third trimester 10/30/2019 by Vena Austria, MD No   Supervision of high risk pregnancy in first trimester 05/20/2019 by Farrel Conners, CNM No   Overview Addendum 11/01/2019  9:05 AM by Vena Austria, MD    [facility] Prenatal Labs  Dating EDD by LMP c/w 10w u/s Blood type: O/Positive/-- (03/26 1057)   Genetic Screen  NIPS:normal xx Antibody:Negative (03/26 1057)  Anatomic [place] complete Rubella: 6.08 (03/26 1057) Varicella: immune  GTT Early:116  Third trimester: 155 3-hr 72 / 120 / 107 / 88 RPR: Non Reactive (03/26 1057)   Rhogam  not needed HBsAg: Negative (03/26 1057)   TDaP vaccine       8/26                Flu Shot: HIV: Non Reactive (03/26 1057)   Baby Food                                GBS:   Contraception  Pap: NIL 2021  CBB     CS/VBAC    Support Person           Previous Version   Obesity in pregnancy 05/20/2019 by Farrel Conners, CNM No   Overview Signed 10/05/2019  5:12 PM by Jean Rosenthal,  Mila Homer, MD    BMI >=40 [ ]  early 1h gtt -  [ ]  screen sleep apnea [ ]  u/s for dating [ ]   [ ]  nutritional goals [ ]  folic acid 1mg  [ ]  bASA (>12 weeks) [ ]  consider nutrition consult [ ]  consider maternal EKG 1st trimester [ ]  Growth u/s 28 [ ] , 32 [ ] , 36 weeks [ ]  [ ]  NST/AFI weekly 34+ weeks (34[] ,35[] ,36[] , 37[] , 38[] , 39[] , 40[] ) [ ]  IOL by 41 weeks (scheduled, prn [] )       Genital herpes simplex 05/20/2019 by , CNM No   Overview Signed  10/05/2019  5:12 PM by , MD    Prophylaxis at 36 weeks          Preterm labor symptoms and general obstetric precautions including but not limited to vaginal bleeding, contractions, leaking of fluid and fetal movement were reviewed in detail with the patient.    Return for needs NST and growth scan added to already scheduled visit.  , CNM 11/29/2019 2:22 PM

## 2019-11-29 NOTE — Progress Notes (Signed)
No vb. No lof.  

## 2019-11-30 LAB — COMPREHENSIVE METABOLIC PANEL
ALT: 21 IU/L (ref 0–32)
AST: 21 IU/L (ref 0–40)
Albumin/Globulin Ratio: 1.4 (ref 1.2–2.2)
Albumin: 3.3 g/dL — ABNORMAL LOW (ref 3.9–5.0)
Alkaline Phosphatase: 98 IU/L (ref 44–121)
BUN/Creatinine Ratio: 11 (ref 9–23)
BUN: 8 mg/dL (ref 6–20)
Bilirubin Total: <0.2 mg/dL (ref 0.0–1.2)
CO2: 17 mmol/L — ABNORMAL LOW (ref 20–29)
Calcium: 8.8 mg/dL (ref 8.7–10.2)
Chloride: 104 mmol/L (ref 96–106)
Creatinine, Ser: 0.72 mg/dL (ref 0.57–1.00)
GFR calc Af Amer: 131 mL/min/{1.73_m2} (ref >59)
GFR calc non Af Amer: 114 mL/min/{1.73_m2} (ref >59)
Globulin, Total: 2.4 g/dL (ref 1.5–4.5)
Glucose: 68 mg/dL (ref 65–99)
Potassium: 4 mmol/L (ref 3.5–5.2)
Sodium: 137 mmol/L (ref 134–144)
Total Protein: 5.7 g/dL — ABNORMAL LOW (ref 6.0–8.5)

## 2019-11-30 LAB — CBC WITH DIFFERENTIAL/PLATELET
Basophils Absolute: 0 10*3/uL (ref 0.0–0.2)
Basos: 0 %
EOS (ABSOLUTE): 0.2 10*3/uL (ref 0.0–0.4)
Eos: 2 %
Hematocrit: 32.6 % — ABNORMAL LOW (ref 34.0–46.6)
Hemoglobin: 11 g/dL — ABNORMAL LOW (ref 11.1–15.9)
Immature Grans (Abs): 0.1 10*3/uL (ref 0.0–0.1)
Immature Granulocytes: 1 %
Lymphocytes Absolute: 1.6 10*3/uL (ref 0.7–3.1)
Lymphs: 16 %
MCH: 28 pg (ref 26.6–33.0)
MCHC: 33.7 g/dL (ref 31.5–35.7)
MCV: 83 fL (ref 79–97)
Monocytes Absolute: 0.9 10*3/uL (ref 0.1–0.9)
Monocytes: 8 %
Neutrophils Absolute: 7.3 10*3/uL — ABNORMAL HIGH (ref 1.4–7.0)
Neutrophils: 73 %
Platelets: 208 10*3/uL (ref 150–450)
RBC: 3.93 x10E6/uL (ref 3.77–5.28)
RDW: 13.2 % (ref 11.7–15.4)
WBC: 10.1 10*3/uL (ref 3.4–10.8)

## 2019-11-30 LAB — PROTEIN / CREATININE RATIO, URINE
Creatinine, Urine: 131 mg/dL
Protein, Ur: 29 mg/dL
Protein/Creat Ratio: 221 mg/g creat — ABNORMAL HIGH (ref 0–200)

## 2019-12-01 LAB — CULTURE, BETA STREP (GROUP B ONLY): Strep Gp B Culture: NEGATIVE

## 2019-12-03 ENCOUNTER — Ambulatory Visit (INDEPENDENT_AMBULATORY_CARE_PROVIDER_SITE_OTHER): Payer: Medicaid Other

## 2019-12-03 ENCOUNTER — Other Ambulatory Visit: Payer: Self-pay

## 2019-12-03 ENCOUNTER — Encounter: Payer: Medicaid Other | Admitting: Obstetrics & Gynecology

## 2019-12-03 ENCOUNTER — Ambulatory Visit (INDEPENDENT_AMBULATORY_CARE_PROVIDER_SITE_OTHER): Payer: Medicaid Other | Admitting: Obstetrics

## 2019-12-03 VITALS — BP 130/86 | Wt 295.0 lb

## 2019-12-03 DIAGNOSIS — Z3A37 37 weeks gestation of pregnancy: Secondary | ICD-10-CM

## 2019-12-03 DIAGNOSIS — O0993 Supervision of high risk pregnancy, unspecified, third trimester: Secondary | ICD-10-CM

## 2019-12-03 DIAGNOSIS — O403XX0 Polyhydramnios, third trimester, not applicable or unspecified: Secondary | ICD-10-CM

## 2019-12-03 DIAGNOSIS — Z6841 Body Mass Index (BMI) 40.0 and over, adult: Secondary | ICD-10-CM

## 2019-12-03 DIAGNOSIS — O99210 Obesity complicating pregnancy, unspecified trimester: Secondary | ICD-10-CM | POA: Diagnosis not present

## 2019-12-03 LAB — POCT URINALYSIS DIPSTICK OB: Glucose, UA: NEGATIVE

## 2019-12-03 NOTE — Progress Notes (Signed)
No concerns.rj 

## 2019-12-03 NOTE — Progress Notes (Signed)
Routine Prenatal Care Visit  Subjective  Kelly Baxter is a 29 y.o. G1P0 at [redacted]w[redacted]d being seen today for ongoing prenatal care.  She is currently monitored for the following issues for this high-risk pregnancy and has Supervision of high risk pregnancy in first trimester; Obesity in pregnancy; Adult BMI 40.0-44.9 kg/sq m (HCC); Genital herpes simplex; Anxiety, generalized; Polyhydramnios in third trimester; Hypertension during pregnancy in third trimester; and Elevated blood pressure affecting pregnancy in third trimester, antepartum on their problem list.  ----------------------------------------------------------------------------------- Patient reports no complaints.  She had a growth scan today which confirms polyhydramnios and her baby is measuring lager for dates. Growth 97%.  .  .   Pincus Large Fluid denies.  ----------------------------------------------------------------------------------- The following portions of the patient's history were reviewed and updated as appropriate: allergies, current medications, past family history, past medical history, past social history, past surgical history and problem list. Problem list updated.  Objective  Blood pressure 130/86, weight 295 lb (133.8 kg), last menstrual period 03/19/2019. Pregravid weight 255 lb (115.7 kg) Total Weight Gain 40 lb (18.1 kg) Urinalysis: Urine Protein Small (1+)  Urine Glucose Negative  Fetal Status:           General:  Alert, oriented and cooperative. Patient is in no acute distress.  Skin: Skin is warm and dry. No rash noted.   Cardiovascular: Normal heart rate noted  Respiratory: Normal respiratory effort, no problems with respiration noted  Abdomen: Soft, gravid, appropriate for gestational age.       Pelvic:  Cervical exam deferred        Extremities: Normal range of motion.     Mental Status: Normal mood and affect. Normal behavior. Normal judgment and thought content.   Assessment   29 y.o. G1P0 at  [redacted]w[redacted]d by  12/24/2019, by Last Menstrual Period presenting for routine prenatal visit  Plan   FIRST Problems (from 05/18/19 to present)    Problem Noted Resolved   Polyhydramnios in third trimester 10/30/2019 by Vena Austria, MD No   Supervision of high risk pregnancy in first trimester 05/20/2019 by Farrel Conners, CNM No   Overview Addendum 12/03/2019  8:45 AM by Mirna Mires, CNM    [facility] Prenatal Labs  Dating EDD by LMP c/w 10w u/s Blood type: O/Positive/-- (03/26 1057)   Genetic Screen  NIPS:normal xx Antibody:Negative (03/26 1057)  Anatomic [place] complete Rubella: 6.08 (03/26 1057) Varicella: immune  GTT Early:116  Third trimester: 155 3-hr 72 / 120 / 107 / 88 RPR: Non Reactive (03/26 1057)   Rhogam  not needed HBsAg: Negative (03/26 1057)   TDaP vaccine       8/26                Flu Shot: HIV: Non Reactive (03/26 1057)   Baby Food                                GBS:  negative  Contraception  Pap: NIL 2021  CBB     CS/VBAC    Support Person           Previous Version   Obesity in pregnancy 05/20/2019 by Farrel Conners, CNM No   Overview Signed 10/05/2019  5:12 PM by Conard Novak, MD    BMI >=40 [ ]  early 1h gtt -  [ ]  screen sleep apnea [ ]  u/s for dating [ ]   [ ]  nutritional goals [ ]   folic acid 1mg  [ ]  bASA (>12 weeks) [ ]  consider nutrition consult [ ]  consider maternal EKG 1st trimester [ ]  Growth u/s 28 [ ] , 32 [ ] , 36 weeks [ ]  [x ] NST/AFI weekly 34+ weeks (34[] ,35[] ,36[] , 37[] , 38[] , 39[] , 40[] ) [ ]  IOL by 41 weeks (scheduled, prn [] )       Genital herpes simplex 05/20/2019 by , CNM No   Overview Signed 10/05/2019  5:12 PM by , MD    Prophylaxis at 36 weeks          Term labor symptoms and general obstetric precautions including but not limited to vaginal bleeding, contractions, leaking of fluid and fetal movement were reviewed in detail with the patient. Please refer to After Visit Summary for  other counseling recommendations.  jshe would love to be induced at 39 weeks. Risk, benefits reviewed. In light of the growth scan and her BP readings , will discuss IOL next week and schedule. She plan on an epidural.  Return in about 1 week (around 12/10/2019) for return OB and weekly NSTs.  , CNM  12/03/2019 10:45 AM

## 2019-12-10 ENCOUNTER — Other Ambulatory Visit: Payer: Self-pay

## 2019-12-10 ENCOUNTER — Ambulatory Visit (INDEPENDENT_AMBULATORY_CARE_PROVIDER_SITE_OTHER): Payer: Medicaid Other | Admitting: Obstetrics & Gynecology

## 2019-12-10 VITALS — BP 136/92 | Wt 309.0 lb

## 2019-12-10 DIAGNOSIS — Z3A38 38 weeks gestation of pregnancy: Secondary | ICD-10-CM

## 2019-12-10 DIAGNOSIS — O403XX0 Polyhydramnios, third trimester, not applicable or unspecified: Secondary | ICD-10-CM

## 2019-12-10 DIAGNOSIS — O99210 Obesity complicating pregnancy, unspecified trimester: Secondary | ICD-10-CM

## 2019-12-10 DIAGNOSIS — O0993 Supervision of high risk pregnancy, unspecified, third trimester: Secondary | ICD-10-CM

## 2019-12-10 LAB — POCT URINALYSIS DIPSTICK OB: Glucose, UA: NEGATIVE

## 2019-12-10 NOTE — Progress Notes (Signed)
  Subjective  Fetal Movement? yes Contractions? no Leaking Fluid? no Vaginal Bleeding? no  Objective  BP (!) 136/92   Wt (!) 309 lb (140.2 kg)   LMP 03/19/2019   BMI 51.42 kg/m  General: NAD Pumonary: no increased work of breathing Abdomen: gravid, non-tender Extremities: no edema Psychiatric: mood appropriate, affect full  Assessment  29 y.o. G1P0 at [redacted]w[redacted]d Kelly  12/24/2019, Kelly Baxter presenting for routine prenatal visit  Plan   Problem List Items Addressed This Visit    [redacted] weeks gestation of pregnancy    -  Primary   Relevant Orders   POC Urinalysis Dipstick OB (Completed)      FIRST Problems (from 05/18/19 to present)    Problem Noted Resolved   Polyhydramnios in third trimester 10/30/2019 Kelly Vena Austria, MD No   Supervision of high risk pregnancy in first trimester 05/20/2019 Kelly Farrel Conners, CNM No   Overview Addendum 12/03/2019  8:45 AM Kelly Mirna Mires, CNM    [facility] P[facility] Labs  Dating EDD Kelly LMP c/w 10w u/s Blood type: O/Positive/-- (03/26 1057)   Genetic Screen  NIPS:normal xx Antibody:Negative (03/26 1057)  Anatomic [place] complete Rubella: 6.08 (03/26 1057) Varicella: immune  GTT Early:116  Third trimester: 155 3-hr 72 / 120 / 107 / 88 RPR: Non Reactive (03/26 1057)   Rhogam  not needed HBsAg: Negative (03/26 1057)   TDaP vaccine       8/26                Flu Shot: HIV: Non Reactive (03/26 1057)   Baby Food                 Breast               GBS:  negative  Contraception  Pap: NIL 2021  CBB  no   CS/VBAC    Support Person           Previous Version   Obesity in pregnancy 05/20/2019 Kelly Farrel Conners, CNM No   Overview Signed 10/05/2019  5:12 PM Kelly Conard Novak, MD    BMI >=40 [ ]  early 1h gtt -  [ ]  screen sleep apnea [ ]  u/s for dating [ ]   [ ]  nutritional goals [ ]  folic acid 1mg  [ ]  bASA (>12 weeks) [ ]  consider nutrition consult [ ]  consider maternal EKG 1st trimester [ ]  Growth u/s 28 [ ] , 32 [  ], 36 weeks [ ]  [ ]  NST/AFI weekly 34+ weeks (34[] ,35[] ,36[] , 37[] , 38[] , 39[] , 40[] ) [ ]  IOL Kelly 41 weeks (scheduled, prn [] )       Genital herpes simplex 05/20/2019 Kelly , CNM No   Overview Signed 10/05/2019  5:12 PM Kelly , MD    Prophylaxis at 36 weeks        IOL 10/26/ 0500  NST AFI next Monday  labor precautions discussed  , MD, Ob/Gyn, [facility] 12/10/2019  2:02 PM

## 2019-12-10 NOTE — Patient Instructions (Addendum)
PRE ADMISSION TESTING For Covid, prior to procedure Monday 9:00-10:00 [facility] entrance (drive up)  Results in 18-84 hours You will not receive notification if test results are negative. If positive for Covid19, your provider will notify you by phone, with additional instructions.   Labor Induction  Labor induction is when steps are taken to cause a pregnant woman to begin the labor process. Most women go into labor on their own between 37 weeks and 42 weeks of pregnancy. When this does not happen or when there is a medical need for labor to begin, steps may be taken to induce labor. Labor induction causes a pregnant woman's uterus to contract. It also causes the cervix to soften (ripen), open (dilate), and thin out (efface). Usually, labor is not induced before 39 weeks of pregnancy unless there is a medical reason to do so. Your health care provider will determine if labor induction is needed. Before inducing labor, your health care provider will consider a number of factors, including:  Your medical condition and your baby's.  How many weeks along you are in your pregnancy.  How mature your baby's lungs are.  The condition of your cervix.  The position of your baby.  The size of your birth canal. What are some reasons for labor induction? Labor may be induced if:  Your health or your baby's health is at risk.  Your pregnancy is overdue by 1 week or more.  Your water breaks but labor does not start on its own.  There is a low amount of amniotic fluid around your baby. You may also choose (elect) to have labor induced at a certain time. Generally, elective labor induction is done no earlier than 39 weeks of pregnancy. What methods are used for labor induction? Methods used for labor induction include:  Prostaglandin medicine. This medicine starts contractions and causes the cervix to dilate and ripen. It can be taken by mouth (orally) or by being inserted into  the vagina (suppository).  Inserting a small, thin tube (catheter) with a balloon into the vagina and then expanding the balloon with water to dilate the cervix.  Stripping the membranes. In this method, your health care provider gently separates amniotic sac tissue from the cervix. This causes the cervix to stretch, which in turn causes the release of a hormone called progesterone. The hormone causes the uterus to contract. This procedure is often done during an office visit, after which you will be sent home to wait for contractions to begin.  Breaking the water. In this method, your health care provider uses a small instrument to make a small hole in the amniotic sac. This eventually causes the amniotic sac to break. Contractions should begin after a few hours.  Medicine to trigger or strengthen contractions. This medicine is given through an IV that is inserted into a vein in your arm. Except for membrane stripping, which can be done in a clinic, labor induction is done in the hospital so that you and your baby can be carefully monitored. How long does it take for labor to be induced? The length of time it takes to induce labor depends on how ready your body is for labor. Some inductions can take up to 2-3 days, while others may take less than a day. Induction may take longer if:  You are induced early in your pregnancy.  It is your first pregnancy.  Your cervix is not ready. What are some risks associated with labor induction? Some risks  associated with labor induction include:  Changes in fetal heart rate, such as being too high, too low, or irregular (erratic).  Failed induction.  Infection in the mother or the baby.  Increased risk of having a cesarean delivery.  Fetal death.  Breaking off (abruption) of the placenta from the uterus (rare).  Rupture of the uterus (very rare). When induction is needed for medical reasons, the benefits of induction generally outweigh the  risks. What are some reasons for not inducing labor? Labor induction should not be done if:  Your baby does not tolerate contractions.  You have had previous surgeries on your uterus, such as a myomectomy, removal of fibroids, or a vertical scar from a previous cesarean delivery.  Your placenta lies very low in your uterus and blocks the opening of the cervix (placenta previa).  Your baby is not in a head-down position.  The umbilical cord drops down into the birth canal in front of the baby.  There are unusual circumstances, such as the baby being very early (premature).  You have had more than 2 previous cesarean deliveries. Summary  Labor induction is when steps are taken to cause a pregnant woman to begin the labor process.  Labor induction causes a pregnant woman's uterus to contract. It also causes the cervix to ripen, dilate, and efface.  Labor is not induced before 39 weeks of pregnancy unless there is a medical reason to do so.  When induction is needed for medical reasons, the benefits of induction generally outweigh the risks. This information is not intended to replace advice given to you by your health care provider. Make sure you discuss any questions you have with your health care provider. Document Revised: 02/11/2017 Document Reviewed: 03/24/2016 Elsevier Patient Education  2020 [employer]  

## 2019-12-12 ENCOUNTER — Telehealth: Payer: Self-pay

## 2019-12-12 ENCOUNTER — Other Ambulatory Visit: Payer: Self-pay

## 2019-12-12 ENCOUNTER — Inpatient Hospital Stay
Admission: EM | Admit: 2019-12-12 | Discharge: 2019-12-16 | DRG: 787 | Disposition: A | Discharge status: Home / Self Care | Payer: Medicaid Other | Attending: Obstetrics and Gynecology | Admitting: Obstetrics and Gynecology

## 2019-12-12 ENCOUNTER — Encounter: Payer: Self-pay | Admitting: Obstetrics & Gynecology

## 2019-12-12 DIAGNOSIS — D62 Acute posthemorrhagic anemia: Secondary | ICD-10-CM | POA: Diagnosis not present

## 2019-12-12 DIAGNOSIS — A6000 Herpesviral infection of urogenital system, unspecified: Secondary | ICD-10-CM | POA: Diagnosis present

## 2019-12-12 DIAGNOSIS — Z3A38 38 weeks gestation of pregnancy: Secondary | ICD-10-CM | POA: Diagnosis not present

## 2019-12-12 DIAGNOSIS — O1404 Mild to moderate pre-eclampsia, complicating childbirth: Principal | ICD-10-CM | POA: Diagnosis present

## 2019-12-12 DIAGNOSIS — O99344 Other mental disorders complicating childbirth: Secondary | ICD-10-CM | POA: Diagnosis present

## 2019-12-12 DIAGNOSIS — O403XX0 Polyhydramnios, third trimester, not applicable or unspecified: Secondary | ICD-10-CM | POA: Diagnosis present

## 2019-12-12 DIAGNOSIS — O0991 Supervision of high risk pregnancy, unspecified, first trimester: Secondary | ICD-10-CM

## 2019-12-12 DIAGNOSIS — Z20822 Contact with and (suspected) exposure to covid-19: Secondary | ICD-10-CM | POA: Diagnosis present

## 2019-12-12 DIAGNOSIS — F1290 Cannabis use, unspecified, uncomplicated: Secondary | ICD-10-CM | POA: Diagnosis present

## 2019-12-12 DIAGNOSIS — O99210 Obesity complicating pregnancy, unspecified trimester: Secondary | ICD-10-CM | POA: Diagnosis present

## 2019-12-12 DIAGNOSIS — O1204 Gestational edema, complicating childbirth: Secondary | ICD-10-CM | POA: Diagnosis present

## 2019-12-12 DIAGNOSIS — O169 Unspecified maternal hypertension, unspecified trimester: Secondary | ICD-10-CM | POA: Diagnosis present

## 2019-12-12 DIAGNOSIS — O9081 Anemia of the puerperium: Secondary | ICD-10-CM | POA: Diagnosis not present

## 2019-12-12 DIAGNOSIS — O1490 Unspecified pre-eclampsia, unspecified trimester: Secondary | ICD-10-CM | POA: Diagnosis present

## 2019-12-12 DIAGNOSIS — A6004 Herpesviral vulvovaginitis: Secondary | ICD-10-CM

## 2019-12-12 DIAGNOSIS — O99214 Obesity complicating childbirth: Secondary | ICD-10-CM | POA: Diagnosis present

## 2019-12-12 DIAGNOSIS — O99324 Drug use complicating childbirth: Secondary | ICD-10-CM | POA: Diagnosis present

## 2019-12-12 DIAGNOSIS — O9832 Other infections with a predominantly sexual mode of transmission complicating childbirth: Secondary | ICD-10-CM | POA: Diagnosis present

## 2019-12-12 DIAGNOSIS — O1493 Unspecified pre-eclampsia, third trimester: Secondary | ICD-10-CM

## 2019-12-12 DIAGNOSIS — O1494 Unspecified pre-eclampsia, complicating childbirth: Secondary | ICD-10-CM | POA: Diagnosis not present

## 2019-12-12 LAB — COMPREHENSIVE METABOLIC PANEL
ALT: 17 U/L (ref 0–44)
AST: 21 U/L (ref 15–41)
Albumin: 2.3 g/dL — ABNORMAL LOW (ref 3.5–5.0)
Alkaline Phosphatase: 108 U/L (ref 38–126)
Anion gap: 6 (ref 5–15)
BUN: 11 mg/dL (ref 6–20)
CO2: 24 mmol/L (ref 22–32)
Calcium: 8.1 mg/dL — ABNORMAL LOW (ref 8.9–10.3)
Chloride: 108 mmol/L (ref 98–111)
Creatinine, Ser: 0.91 mg/dL (ref 0.44–1.00)
GFR, Estimated: >60 mL/min (ref >60)
Glucose, Bld: 74 mg/dL (ref 70–99)
Potassium: 4.3 mmol/L (ref 3.5–5.1)
Sodium: 138 mmol/L (ref 135–145)
Total Bilirubin: 0.5 mg/dL (ref 0.3–1.2)
Total Protein: 5.4 g/dL — ABNORMAL LOW (ref 6.5–8.1)

## 2019-12-12 LAB — PROTEIN / CREATININE RATIO, URINE
Creatinine, Urine: 147 mg/dL
Protein Creatinine Ratio: 4.44 mg/mg{Cre} — ABNORMAL HIGH (ref 0.00–0.15)
Total Protein, Urine: 652 mg/dL

## 2019-12-12 LAB — CBC
HCT: 31.2 % — ABNORMAL LOW (ref 36.0–46.0)
Hemoglobin: 9.9 g/dL — ABNORMAL LOW (ref 12.0–15.0)
MCH: 27.1 pg (ref 26.0–34.0)
MCHC: 31.7 g/dL (ref 30.0–36.0)
MCV: 85.5 fL (ref 80.0–100.0)
Platelets: 154 10*3/uL (ref 150–400)
RBC: 3.65 MIL/uL — ABNORMAL LOW (ref 3.87–5.11)
RDW: 14.7 % (ref 11.5–15.5)
WBC: 9.3 10*3/uL (ref 4.0–10.5)
nRBC: 0.2 % (ref 0.0–0.2)

## 2019-12-12 LAB — RESPIRATORY PANEL BY RT PCR (FLU A&B, COVID)
Influenza A by PCR: NEGATIVE
Influenza B by PCR: NEGATIVE
SARS Coronavirus 2 by RT PCR: NEGATIVE

## 2019-12-12 LAB — URINE DRUG SCREEN, QUALITATIVE (ARMC ONLY)
Amphetamines, Ur Screen: NOT DETECTED
Barbiturates, Ur Screen: NOT DETECTED
Benzodiazepine, Ur Scrn: NOT DETECTED
Cannabinoid 50 Ng, Ur ~~LOC~~: NOT DETECTED
Cocaine Metabolite,Ur ~~LOC~~: NOT DETECTED
MDMA (Ecstasy)Ur Screen: NOT DETECTED
Methadone Scn, Ur: NOT DETECTED
Opiate, Ur Screen: NOT DETECTED
Phencyclidine (PCP) Ur S: NOT DETECTED
Tricyclic, Ur Screen: NOT DETECTED

## 2019-12-12 LAB — TYPE AND SCREEN
ABO/RH(D): O POS
Antibody Screen: NEGATIVE

## 2019-12-12 LAB — ABO/RH: ABO/RH(D): O POS

## 2019-12-12 MED ORDER — OXYTOCIN 10 UNIT/ML IJ SOLN
INTRAMUSCULAR | Status: AC
  Filled 2019-12-12: qty 2

## 2019-12-12 MED ORDER — SOD CITRATE-CITRIC ACID 500-334 MG/5ML PO SOLN
30.0000 mL | ORAL | Status: DC | PRN
  Filled 2019-12-12: qty 15

## 2019-12-12 MED ORDER — OXYCODONE-ACETAMINOPHEN 5-325 MG PO TABS: 2.0000 | ORAL_TABLET | ORAL | Status: DC | PRN

## 2019-12-12 MED ORDER — LABETALOL HCL 5 MG/ML IV SOLN: 20.0000 mg | INTRAVENOUS | Status: DC | PRN

## 2019-12-12 MED ORDER — OXYTOCIN-SODIUM CHLORIDE 30-0.9 UT/500ML-% IV SOLN
1.0000 m[IU]/min | INTRAVENOUS | Status: DC
  Administered 2019-12-13: 2 m[IU]/min via INTRAVENOUS
  Administered 2019-12-14: 15 m[IU]/min via INTRAVENOUS
  Filled 2019-12-12: qty 500

## 2019-12-12 MED ORDER — PANTOPRAZOLE SODIUM 40 MG PO TBEC
40.0000 mg | DELAYED_RELEASE_TABLET | Freq: Every day | ORAL | Status: DC
  Administered 2019-12-12 – 2019-12-16 (×5): 40 mg via ORAL
  Filled 2019-12-12 (×5): qty 1

## 2019-12-12 MED ORDER — LACTATED RINGERS IV SOLN
500.0000 mL | INTRAVENOUS | Status: DC | PRN
  Administered 2019-12-14 (×2): 250 mL via INTRAVENOUS

## 2019-12-12 MED ORDER — AMMONIA AROMATIC IN INHA
RESPIRATORY_TRACT | Status: AC
  Filled 2019-12-12: qty 10

## 2019-12-12 MED ORDER — ONDANSETRON HCL 4 MG/2ML IJ SOLN
4.0000 mg | Freq: Four times a day (QID) | INTRAMUSCULAR | Status: DC | PRN
  Administered 2019-12-14: 4 mg via INTRAVENOUS

## 2019-12-12 MED ORDER — OXYTOCIN BOLUS FROM INFUSION: 333.0000 mL | Freq: Once | INTRAVENOUS | Status: DC

## 2019-12-12 MED ORDER — MISOPROSTOL 200 MCG PO TABS
ORAL_TABLET | ORAL | Status: AC
  Filled 2019-12-12: qty 4

## 2019-12-12 MED ORDER — TERBUTALINE SULFATE 1 MG/ML IJ SOLN: 0.2500 mg | Freq: Once | INTRAMUSCULAR | Status: DC | PRN

## 2019-12-12 MED ORDER — ACETAMINOPHEN 325 MG PO TABS
650.0000 mg | ORAL_TABLET | ORAL | Status: DC | PRN
  Administered 2019-12-14: 650 mg via ORAL

## 2019-12-12 MED ORDER — LACTATED RINGERS IV SOLN: INTRAVENOUS | Status: DC

## 2019-12-12 MED ORDER — OXYCODONE-ACETAMINOPHEN 5-325 MG PO TABS: 1.0000 | ORAL_TABLET | ORAL | Status: DC | PRN

## 2019-12-12 MED ORDER — ACETAMINOPHEN 325 MG PO TABS
650.0000 mg | ORAL_TABLET | ORAL | Status: DC | PRN
  Filled 2019-12-12: qty 2

## 2019-12-12 MED ORDER — HYDRALAZINE HCL 20 MG/ML IJ SOLN: 10.0000 mg | INTRAMUSCULAR | Status: DC | PRN

## 2019-12-12 MED ORDER — LIDOCAINE HCL (PF) 1 % IJ SOLN
30.0000 mL | INTRAMUSCULAR | Status: DC | PRN
  Filled 2019-12-12: qty 30

## 2019-12-12 MED ORDER — ONDANSETRON HCL 4 MG/2ML IJ SOLN
4.0000 mg | Freq: Four times a day (QID) | INTRAMUSCULAR | Status: DC | PRN
  Filled 2019-12-12: qty 2

## 2019-12-12 MED ORDER — LABETALOL HCL 5 MG/ML IV SOLN: 40.0000 mg | INTRAVENOUS | Status: DC | PRN

## 2019-12-12 MED ORDER — VALACYCLOVIR HCL 500 MG PO TABS
500.0000 mg | ORAL_TABLET | Freq: Two times a day (BID) | ORAL | Status: DC
  Administered 2019-12-12 – 2019-12-15 (×5): 500 mg via ORAL
  Filled 2019-12-12 (×7): qty 1

## 2019-12-12 MED ORDER — MISOPROSTOL 25 MCG QUARTER TABLET
25.0000 ug | ORAL_TABLET | ORAL | Status: DC | PRN
  Administered 2019-12-12 – 2019-12-13 (×3): 25 ug via VAGINAL
  Filled 2019-12-12 (×4): qty 1

## 2019-12-12 MED ORDER — OXYTOCIN-SODIUM CHLORIDE 30-0.9 UT/500ML-% IV SOLN
2.5000 [IU]/h | INTRAVENOUS | Status: DC
  Filled 2019-12-12: qty 500

## 2019-12-12 MED ORDER — HYDRALAZINE HCL 20 MG/ML IJ SOLN: 5.0000 mg | INTRAMUSCULAR | Status: DC | PRN

## 2019-12-12 NOTE — Telephone Encounter (Addendum)
Pt calling; is having swelling from waist down; has been having swelling but this is 10 times worse.  Is scheduled for IOL Tues; was told she would get a call about when to go get her covid test; hasn't heard from anyone and doesn't know who to reach out to.  [phone]  Pt aware per L&D to go to [facility] Monday 9am-10am to get tested; it's drive up; no appt needed. Pt states it brings tears to her eyes bending her knees; hurts to stand up straight; no ctxs; good fm. Will get back to pt about swelling.

## 2019-12-12 NOTE — H&P (Signed)
Obstetric H&P   Chief Complaint: Increased swelling  Prenatal Care Provider: [facility]  History of Present Illness: 29 y.o. G1P0 [redacted]w[redacted]d by 12/24/2019, by Last Menstrual Period presenting to L&D with worsening swelling. She denies headaches, vision changes, RUQ or epigastric pain. +FM, no LOF, no VB, no ctx.  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  Last growth scan 3790g (8lbs 6lz) on 12/03/2019 at 37 weeks c/w >90%ile and AC 97.7%ile, AFI 23.7cm.   Pregravid weight 115.7 kg Total Weight Gain 24.5 kg  FIRST Problems (from 05/18/19 to present)    Problem Noted Resolved   Polyhydramnios in third trimester 10/30/2019 by Vena Austria, MD No   Supervision of high risk pregnancy in first trimester 05/20/2019 by Farrel Conners, CNM No   Overview Addendum 12/03/2019  8:45 AM by Mirna Mires, CNM    [facility] Prenatal Labs  Dating EDD by LMP c/w 10w u/s Blood type: O/Positive/-- (03/26 1057)   Genetic Screen  NIPS:normal xx Antibody:Negative (03/26 1057)  Anatomic [place] complete Rubella: 6.08 (03/26 1057) Varicella: immune  GTT Early:116  Third trimester: 155 3-hr 72 / 120 / 107 / 88 RPR: Non Reactive (03/26 1057)   Rhogam  not needed HBsAg: Negative (03/26 1057)   TDaP vaccine       8/26                Flu Shot: HIV: Non Reactive (03/26 1057)   Baby Food                                GBS:  negative  Contraception  Pap: NIL 2021  CBB     CS/VBAC    Support Person           Previous Version   Obesity in pregnancy 05/20/2019 by Farrel Conners, CNM No   Overview Signed 10/05/2019  5:12 PM by Conard Novak, MD    BMI >=40 [ ]  early 1h gtt -  [ ]  screen sleep apnea [ ]  u/s for dating [ ]   [ ]  nutritional goals [ ]  folic acid 1mg  [ ]  bASA (>12 weeks) [ ]  consider nutrition  consult [ ]  consider maternal EKG 1st trimester [ ]  Growth u/s 28 [ ] , 32 [ ] , 36 weeks [ ]  [ ]  NST/AFI weekly 34+ weeks (34[] ,35[] ,36[] , 37[] , 38[] , 39[] , 40[] ) [ ]  IOL by 41 weeks (scheduled, prn [] )       Genital herpes simplex 05/20/2019 by , CNM No   Overview Signed 10/05/2019  5:12 PM by , MD    Prophylaxis at 36 weeks          Review of Systems: 10 point review of systems negative unless otherwise noted in HPI  Past Medical History: Patient Active Problem List   Diagnosis Date Noted  . Hypertension complicating pregnancy 12/12/2019  . [redacted] weeks gestation of pregnancy 12/12/2019  . Hypertension during pregnancy in third trimester 11/15/2019  . Elevated blood pressure affecting pregnancy in third trimester, antepartum 11/15/2019  . Polyhydramnios in third trimester 10/30/2019  . Supervision of high risk pregnancy in first trimester 05/20/2019    [facility]inic [facility] Prenatal Labs  Dating EDD by LMP c/w 10w u/s Blood type: O/Positive/-- (03/26 1057)   Genetic Screen  NIPS:normal xx Antibody:Negative (03/26 1057)  Anatomic US complete Rubella: 6.08 (03/26 1057) Varicella: immune  GTT Early:116  Third trimester: 155 3-hr 72 / 120 / 107 / 88 RPR: Non Reactive (03/26 1057)   Rhogam  not needed HBsAg: Negative (03/26 1057)   TDaP vaccine       8/26                Flu Shot: HIV: Non Reactive (03/26 1057)   Baby Food                                GBS:  negative  Contraception  Pap: NIL 2021  CBB     CS/VBAC    Support Person Husband and Mother       . Obesity in pregnancy 05/20/2019    BMI >=40 [ ]  early 1h gtt -  [ ]  screen sleep apnea [ ]  u/s for dating [ ]   [ ]  nutritional goals [ ]  folic acid 1mg  [ ]  bASA (>12 weeks) [ ]  consider nutrition consult [ ]  consider maternal EKG 1st trimester [ ]  Growth u/s 28 [ ] , 32 [ ] , 36 weeks [ ]  [ ]  NST/AFI weekly 34+ weeks (34[] ,35[] ,36[] , 37[] , 38[] , 39[] , 40[] ) [ ]  IOL by 41 weeks (scheduled, prn  [] )    . Adult BMI 40.0-44.9 kg/sq m (HCC) 05/20/2019  . Genital herpes simplex 05/20/2019    Prophylaxis at 36 weeks   . Anxiety, generalized 02/07/2017    Past Surgical History: Past Surgical History:  Procedure Laterality Date  . NO PAST SURGERIES      Past Obstetric History: # 1 - Date: None, Sex: None, Weight: None, GA: None, Delivery: None, Apgar1: None, Apgar5: None, Living: None, Birth Comments: None   Past Gynecologic History:  Family History: Family History  Problem Relation Age of Onset  . Heart Problems Mother   . Thyroid disease Mother   . Diabetes Maternal Grandmother   . Diabetes Maternal Grandfather   . Hypertension Maternal Grandfather   . Diabetes Maternal Aunt   . Diabetes Maternal Aunt     Social History: Social History   Socioeconomic History  . Marital status: Single    Spouse name: Not on file  . Number of children: 0  . Years of education: Not on file  . Highest education level: Not on file  Occupational History  . 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 Smoking status: Never Smoker  . Smokeless tobacco: Never Used  Vaping Use  . Vaping Use: Never used  Substance and Sexual Activity  . Alcohol use: Not Currently  . Drug use: No  . Sexual activity: Yes    Birth control/protection: None    Comment: undecided  Other Topics Concern  . Not on file  Social History Narrative  . Not on file   Social Determinants of Health   Financial Resource Strain:   . Difficulty of Paying Living Expenses: Not on file  Food Insecurity:   . Worried About Programme researcher, broadcasting/film/videounning Out of Food in the Last Year: Not on file  . Ran Out of Food in the Last Year: Not on file  Transportation Needs:   . Lack of Transportation (Medical): Not on file  . Lack of Transportation (Non-Medical): Not on file  Physical Activity:   . Days of Exercise per Week: Not on file  . Minutes of Exercise per  Session: Not on file  Stress:   . Feeling of Stress :  Not on file  Social Connections:   . Frequency of Communication with Friends and Family: Not on file  . Frequency of Social Gatherings with Friends and Family: Not on file  . Attends Religious Services: Not on file  . Active Member of Clubs or Organizations: Not on file  . Attends Banker Meetings: Not on file  . Marital Status: Not on file  Intimate Partner Violence:   . Fear of Current or Ex-Partner: Not on file  . Emotionally Abused: Not on file  . Physically Abused: Not on file  . Sexually Abused: Not on file    Medications: Prior to Admission medications   Medication Sig Start Date End Date Taking? Authorizing Provider  docusate sodium (COLACE) 100 MG capsule Take 100 mg by mouth daily as needed for mild constipation.   Yes [provider]  fluticasone (FLONASE) 50 MCG/ACT nasal spray Place 2 sprays into both nostrils daily. As needed for nasal congestion 01/08/15  Yes Jolene Provost, MD  pantoprazole (PROTONIX) 40 MG tablet Take 40 mg by mouth daily. 11/28/18  Yes [provider]  Prenatal Vit-Fe Fumarate-FA (MULTIVITAMIN-PRENATAL) 27-0.8 MG TABS tablet Take 1 tablet by mouth daily at 12 noon.   Yes [provider]  valACYclovir (VALTREX) 500 MG tablet Take 1 tablet (500 mg total) by mouth 2 (two) times daily. 11/26/19  Yes Mirna Mires, CNM  acyclovir (ZOVIRAX) 400 MG tablet Take 400 mg by mouth as needed.  01/22/19   [provider]  cetirizine (ZYRTEC) 10 MG tablet Take 10 mg by mouth daily.  06/04/19  [provider]    Allergies: Allergies  Allergen Reactions  . Aluminum Hives and Rash  . Aluminum-Containing Compounds Hives  . Other     Trees, weeds, grass, dust mites.    Physical Exam: Vitals: Blood pressure (!) 150/97, pulse 67, temperature 97.9 F (36.6 C), temperature source Oral, resp. rate 18, height 5\' 5"  (1.651 m), weight (!) 140.2 kg, last menstrual period 03/19/2019, SpO2 99 %  FHT: 130, moderate,  +accels, no decels Toco: none  General: NAD HEENT: normocephalic, anicteric Pulmonary: No increased work of breathing Cardiovascular: RRR, distal pulses 2+ Abdomen: Gravid, non-tender [place]: vtx Genitourinary: Speculum exam no visible lesions, cervix ftp/50/-3 Extremities: no edema, erythema, or tenderness Neurologic: Grossly intact Psychiatric: mood appropriate, affect full  Labs: Results for orders placed or performed during the hospital encounter of 12/12/19 (from the past 24 hour(s))  Protein / creatinine ratio, urine     Status: Abnormal   Collection Time: 12/12/19  5:23 PM  Result Value Ref Range   Creatinine, Urine 147 mg/dL   Total Protein, Urine 652 mg/dL   Protein Creatinine Ratio 4.44 (H) 0.00 - 0.15 mg/mg[Cre]  CBC     Status: Abnormal   Collection Time: 12/12/19  5:40 PM  Result Value Ref Range   WBC 9.3 4.0 - 10.5 K/uL   RBC 3.65 (L) 3.87 - 5.11 MIL/uL   Hemoglobin 9.9 (L) 12.0 - 15.0 g/dL   HCT 12/14/19 (L) 36 - 46 %   MCV 85.5 80.0 - 100.0 fL   MCH 27.1 26.0 - 34.0 pg   MCHC 31.7 30.0 - 36.0 g/dL   RDW 68.3 41.9 - 62.2 %   Platelets 154 150 - 400 K/uL   nRBC 0.2 0.0 - 0.2 %  Comprehensive metabolic panel     Status: Abnormal   Collection  Time: 12/12/19  5:40 PM  Result Value Ref Range   Sodium 138 135 - 145 mmol/L   Potassium 4.3 3.5 - 5.1 mmol/L   Chloride 108 98 - 111 mmol/L   CO2 24 22 - 32 mmol/L   Glucose, Bld 74 70 - 99 mg/dL   BUN 11 6 - 20 mg/dL   Creatinine, Ser 1.47 0.44 - 1.00 mg/dL   Calcium 8.1 (L) 8.9 - 10.3 mg/dL   Total Protein 5.4 (L) 6.5 - 8.1 g/dL   Albumin 2.3 (L) 3.5 - 5.0 g/dL   AST 21 15 - 41 U/L   ALT 17 0 - 44 U/L   Alkaline Phosphatase 108 38 - 126 U/L   Total Bilirubin 0.5 0.3 - 1.2 mg/dL   GFR, Estimated >82 >95 mL/min   Anion gap 6 5 - 15    Assessment: 29 y.o. G1P0 [redacted]w[redacted]d by 12/24/2019, by Last Menstrual Period with preeclampsia without severe features  Plan: 1) Preeclampsia without severe features - no history of  CHTN, no elevated BP's noted prior to 20 weeks - has been followed for GHTN - Given new onset proteinuria will admit for induction of labor for preeclampsia with severe features.  Should severe range BP's develop, or the patient develop neurologic symptoms discussed proceeding with magnesium sulfate for seizure ppx. - will recheck labs in 8-hrs given slight bump in serum creatinine.    2) Fetus -cat I tracing  3) PNL - Blood type O/Positive/-- (03/26 1057) / Anti-bodyscreen Negative (03/26 1057) / Rubella 6.08 (03/26 1057) / Varicella Immune / RPR Non Reactive (08/13 1549) / HBsAg Negative (03/26 1057) / HIV Non Reactive (08/13 1549) / 1-hr OGTT 153 3-hr normal / GBS Negative/-- (10/05 1640)  4) Immunization History -  Immunization History  Administered Date(s) Administered  . Tdap 10/18/2019    5) HSV - has been on appropriate suppression in third trimester with no visible lesion on speculum exam at time of admission, no prodromal symptoms reported by patient.  6) Disposition - pending delveyr  Vena Austria, MD, [facility], [facility] 12/12/2019, 7:32 PM

## 2019-12-12 NOTE — Telephone Encounter (Signed)
Pt was adv to go to L&D via ED.  Misty notified.

## 2019-12-13 LAB — COMPREHENSIVE METABOLIC PANEL
ALT: 17 U/L (ref 0–44)
AST: 23 U/L (ref 15–41)
Albumin: 2.4 g/dL — ABNORMAL LOW (ref 3.5–5.0)
Alkaline Phosphatase: 129 U/L — ABNORMAL HIGH (ref 38–126)
Anion gap: 9 (ref 5–15)
BUN: 10 mg/dL (ref 6–20)
CO2: 22 mmol/L (ref 22–32)
Calcium: 8.2 mg/dL — ABNORMAL LOW (ref 8.9–10.3)
Chloride: 107 mmol/L (ref 98–111)
Creatinine, Ser: 0.91 mg/dL (ref 0.44–1.00)
GFR, Estimated: >60 mL/min (ref >60)
Glucose, Bld: 85 mg/dL (ref 70–99)
Potassium: 4.1 mmol/L (ref 3.5–5.1)
Sodium: 138 mmol/L (ref 135–145)
Total Bilirubin: 0.5 mg/dL (ref 0.3–1.2)
Total Protein: 5.5 g/dL — ABNORMAL LOW (ref 6.5–8.1)

## 2019-12-13 LAB — CBC
HCT: 33.4 % — ABNORMAL LOW (ref 36.0–46.0)
Hemoglobin: 10.6 g/dL — ABNORMAL LOW (ref 12.0–15.0)
MCH: 27.3 pg (ref 26.0–34.0)
MCHC: 31.7 g/dL (ref 30.0–36.0)
MCV: 86.1 fL (ref 80.0–100.0)
Platelets: 158 10*3/uL (ref 150–400)
RBC: 3.88 MIL/uL (ref 3.87–5.11)
RDW: 14.9 % (ref 11.5–15.5)
WBC: 10.5 10*3/uL (ref 4.0–10.5)
nRBC: 0.2 % (ref 0.0–0.2)

## 2019-12-13 LAB — RPR: RPR Ser Ql: NONREACTIVE

## 2019-12-13 MED ORDER — SODIUM CHLORIDE (PF) 0.9 % IJ SOLN
INTRAMUSCULAR | Status: AC
  Filled 2019-12-13: qty 50

## 2019-12-13 MED ORDER — OXYTOCIN-SODIUM CHLORIDE 30-0.9 UT/500ML-% IV SOLN
1.0000 m[IU]/min | INTRAVENOUS | Status: DC
  Filled 2019-12-13: qty 500

## 2019-12-13 NOTE — Progress Notes (Signed)
Contact MD, update giving on assessment conducted by RN. Pt has an abnormal accumulation of fluid within the body tissue, +3 pitting in her lower extremities (from her buttocks to her ankles). MD aware, no new orders giving.

## 2019-12-13 NOTE — Progress Notes (Signed)
Kelly Baxter is a 29 y.o. G1P0 at [redacted]w[redacted]d by ultrasound admitted for induction of labor due to Pre-eclamptic toxemia of pregnancy..  Subjective: she is awake, but not feeling any cramping or contraction pain. She denies any headache or visual disturbance.No LOF or vaginal bleeding. Her BF and mother are present and supportive at the bedside.  Her lega and feet are quite edematous.  Objective: BP (!) 145/99   Pulse 71   Temp 98 F (36.7 C) (Oral)   Resp 18   Ht 5\' 5"  (1.651 m)   Wt (!) 140.2 kg   LMP 03/19/2019   SpO2 99%   BMI 51.42 kg/m  No intake/output data recorded. No intake/output data recorded.  FHT:  FHR: 135 bpm, variability: moderate,  accelerations:  Present,  decelerations:  Absent UC:   irregular, every 2-5 minutes, mild per palpation SVE:   Dilation: 1 Effacement (%): 50 Station: -3 Exam by:: [email or web address] CNM  Bishop score:3  Labs: Lab Results  Component Value Date   WBC 9.3 12/12/2019   HGB 9.9 (L) 12/12/2019   HCT 31.2 (L) 12/12/2019   MCV 85.5 12/12/2019   PLT 154 12/12/2019    Assessment / Plan: Induction of labor due to preeclampsia,  Ongoing cervical ripening with Cytotec  Labor: cervical ripening at present. Anticipate pitocin infusion pr protocol once more dilated and effaced. Preeclampsia:  no signs or symptoms of toxicity and intake and ouput balanced. Will repeat labs at 0200 Fetal Wellbeing:  Category I Pain Control:  Labor support without medications  At present. Likely epidural I/D:  n/a Anticipated MOD:  NSVD  Cytotec #2 placed by 12/14/2019 CNM. Will recheck in 4-6 hours and continue with additional dosing of Cytotec.  Liana Crocker 12/13/2019, 1:06 AM

## 2019-12-13 NOTE — Progress Notes (Signed)
Subjective:  Patient comfortable, some contractions with cytotec but still light.  Still no headaches or vision changes.  Objective:   Vitals: Blood pressure 140/87, pulse 73, temperature 97.9 F (36.6 C), temperature source Oral, resp. rate 18, height 5\' 5"  (1.651 m), weight (!) 140.2 kg, last menstrual period 03/19/2019, SpO2 99 %. General: NAD Abdomen: gravid non-tender Cervical Exam:  Dilation: 1 Effacement (%): 50 Cervical Position: Posterior Station: -3 Presentation: Vertex Exam by:: [email or web address], MD  FHT: 130, moderate, +accels, no decels Toco: q3-34min  Results for orders placed or performed during the hospital encounter of 12/12/19 (from the past 24 hour(s))  Protein / creatinine ratio, urine     Status: Abnormal   Collection Time: 12/12/19  5:23 PM  Result Value Ref Range   Creatinine, Urine 147 mg/dL   Total Protein, Urine 652 mg/dL   Protein Creatinine Ratio 4.44 (H) 0.00 - 0.15 mg/mg[Cre]  Urine Drug Screen, Qualitative ([facility] only)     Status: None   Collection Time: 12/12/19  5:23 PM  Result Value Ref Range   Tricyclic, Ur Screen NONE DETECTED NONE DETECTED   Amphetamines, Ur Screen NONE DETECTED NONE DETECTED   MDMA (Ecstasy)Ur Screen NONE DETECTED NONE DETECTED   Cocaine Metabolite,Ur [place] NONE DETECTED NONE DETECTED   Opiate, Ur Screen NONE DETECTED NONE DETECTED   Phencyclidine (PCP) Ur S NONE DETECTED NONE DETECTED   Cannabinoid 50 Ng, Ur [place] NONE DETECTED NONE DETECTED   Barbiturates, Ur Screen NONE DETECTED NONE DETECTED   Benzodiazepine, Ur Scrn NONE DETECTED NONE DETECTED   Methadone Scn, Ur NONE DETECTED NONE DETECTED  CBC     Status: Abnormal   Collection Time: 12/12/19  5:40 PM  Result Value Ref Range   WBC 9.3 4.0 - 10.5 K/uL   RBC 3.65 (L) 3.87 - 5.11 MIL/uL   Hemoglobin 9.9 (L) 12.0 - 15.0 g/dL   HCT 12/14/19 (L) 36 - 46 %   MCV 85.5 80.0 - 100.0 fL   MCH 27.1 26.0 - 34.0 pg   MCHC 31.7 30.0 - 36.0 g/dL   RDW 29.9 24.2 - 68.3 %   Platelets 154  150 - 400 K/uL   nRBC 0.2 0.0 - 0.2 %  Comprehensive metabolic panel     Status: Abnormal   Collection Time: 12/12/19  5:40 PM  Result Value Ref Range   Sodium 138 135 - 145 mmol/L   Potassium 4.3 3.5 - 5.1 mmol/L   Chloride 108 98 - 111 mmol/L   CO2 24 22 - 32 mmol/L   Glucose, Bld 74 70 - 99 mg/dL   BUN 11 6 - 20 mg/dL   Creatinine, Ser 12/14/19 0.44 - 1.00 mg/dL   Calcium 8.1 (L) 8.9 - 10.3 mg/dL   Total Protein 5.4 (L) 6.5 - 8.1 g/dL   Albumin 2.3 (L) 3.5 - 5.0 g/dL   AST 21 15 - 41 U/L   ALT 17 0 - 44 U/L   Alkaline Phosphatase 108 38 - 126 U/L   Total Bilirubin 0.5 0.3 - 1.2 mg/dL   GFR, Estimated 6.22 >29 mL/min   Anion gap 6 5 - 15  ABO/Rh     Status: None   Collection Time: 12/12/19  5:40 PM  Result Value Ref Range   ABO/RH(D)      O POS Performed at [facility], [address] [place], [place]   Type and screen [facility]     Status: None   Collection Time:  12/12/19  7:48 PM  Result Value Ref Range   ABO/RH(D) O POS    Antibody Screen NEG    Sample Expiration      12/15/2019,2359 Performed at [facility], [address] [place], [place] [address]   Respiratory Panel by RT PCR (Flu A&B, Covid) - Nasopharyngeal Swab     Status: None   Collection Time: 12/12/19  7:48 PM   Specimen: Nasopharyngeal Swab  Result Value Ref Range   SARS Coronavirus 2 by RT PCR NEGATIVE NEGATIVE   Influenza A by PCR NEGATIVE NEGATIVE   Influenza B by PCR NEGATIVE NEGATIVE  Comprehensive metabolic panel     Status: Abnormal   Collection Time: 12/13/19  2:36 AM  Result Value Ref Range   Sodium 138 135 - 145 mmol/L   Potassium 4.1 3.5 - 5.1 mmol/L   Chloride 107 98 - 111 mmol/L   CO2 22 22 - 32 mmol/L   Glucose, Bld 85 70 - 99 mg/dL   BUN 10 6 - 20 mg/dL   Creatinine, Ser 2.45 0.44 - 1.00 mg/dL   Calcium 8.2 (L) 8.9 - 10.3 mg/dL   Total Protein 5.5 (L) 6.5 - 8.1 g/dL   Albumin 2.4 (L) 3.5 - 5.0 g/dL   AST 23 15 - 41 U/L   ALT  17 0 - 44 U/L   Alkaline Phosphatase 129 (H) 38 - 126 U/L   Total Bilirubin 0.5 0.3 - 1.2 mg/dL   GFR, Estimated >80 >99 mL/min   Anion gap 9 5 - 15  CBC     Status: Abnormal   Collection Time: 12/13/19  2:36 AM  Result Value Ref Range   WBC 10.5 4.0 - 10.5 K/uL   RBC 3.88 3.87 - 5.11 MIL/uL   Hemoglobin 10.6 (L) 12.0 - 15.0 g/dL   HCT 83.3 (L) 36 - 46 %   MCV 86.1 80.0 - 100.0 fL   MCH 27.3 26.0 - 34.0 pg   MCHC 31.7 30.0 - 36.0 g/dL   RDW 82.5 05.3 - 97.6 %   Platelets 158 150 - 400 K/uL   nRBC 0.2 0.0 - 0.2 %    Assessment:   28 y.o. G1P0 [redacted]w[redacted]d preeclampsia without severe features  Plan:   1) Labor - continue with IOL, cervical foley placed, switch to pitocin  2) Fetus - cat I tracing  3) Preeclampsia - repeat labs this AM stable, BP remain mild range with patient asymptomatic  Vena Austria, MD, [facility], [facility] 12/13/2019, 9:41 AM

## 2019-12-14 ENCOUNTER — Inpatient Hospital Stay: Payer: Medicaid Other | Admitting: Anesthesiology

## 2019-12-14 ENCOUNTER — Encounter
Admission: EM | Disposition: A | Discharge status: Home / Self Care | Payer: Self-pay | Source: Home / Self Care | Attending: Obstetrics and Gynecology

## 2019-12-14 ENCOUNTER — Encounter: Payer: Self-pay | Admitting: Obstetrics

## 2019-12-14 DIAGNOSIS — O1490 Unspecified pre-eclampsia, unspecified trimester: Secondary | ICD-10-CM | POA: Diagnosis present

## 2019-12-14 DIAGNOSIS — O1494 Unspecified pre-eclampsia, complicating childbirth: Secondary | ICD-10-CM

## 2019-12-14 DIAGNOSIS — Z3A38 38 weeks gestation of pregnancy: Secondary | ICD-10-CM

## 2019-12-14 LAB — COMPREHENSIVE METABOLIC PANEL
ALT: 17 U/L (ref 0–44)
AST: 22 U/L (ref 15–41)
Albumin: 2.6 g/dL — ABNORMAL LOW (ref 3.5–5.0)
Alkaline Phosphatase: 132 U/L — ABNORMAL HIGH (ref 38–126)
Anion gap: 11 (ref 5–15)
BUN: 10 mg/dL (ref 6–20)
CO2: 20 mmol/L — ABNORMAL LOW (ref 22–32)
Calcium: 8.4 mg/dL — ABNORMAL LOW (ref 8.9–10.3)
Chloride: 104 mmol/L (ref 98–111)
Creatinine, Ser: 0.96 mg/dL (ref 0.44–1.00)
GFR, Estimated: >60 mL/min (ref >60)
Glucose, Bld: 75 mg/dL (ref 70–99)
Potassium: 4.1 mmol/L (ref 3.5–5.1)
Sodium: 135 mmol/L (ref 135–145)
Total Bilirubin: 0.4 mg/dL (ref 0.3–1.2)
Total Protein: 5.8 g/dL — ABNORMAL LOW (ref 6.5–8.1)

## 2019-12-14 LAB — CBC
HCT: 35.6 % — ABNORMAL LOW (ref 36.0–46.0)
Hemoglobin: 11.5 g/dL — ABNORMAL LOW (ref 12.0–15.0)
MCH: 27.4 pg (ref 26.0–34.0)
MCHC: 32.3 g/dL (ref 30.0–36.0)
MCV: 85 fL (ref 80.0–100.0)
Platelets: 187 10*3/uL (ref 150–400)
RBC: 4.19 MIL/uL (ref 3.87–5.11)
RDW: 15.3 % (ref 11.5–15.5)
WBC: 11.9 10*3/uL — ABNORMAL HIGH (ref 4.0–10.5)
nRBC: 0 % (ref 0.0–0.2)

## 2019-12-14 SURGERY — Surgical Case
Anesthesia: Epidural

## 2019-12-14 MED ORDER — PHENYLEPHRINE 40 MCG/ML (10ML) SYRINGE FOR IV PUSH (FOR BLOOD PRESSURE SUPPORT): 80.0000 ug | PREFILLED_SYRINGE | INTRAVENOUS | Status: DC | PRN

## 2019-12-14 MED ORDER — LACTATED RINGERS IV SOLN: 500.0000 mL | Freq: Once | INTRAVENOUS | Status: DC

## 2019-12-14 MED ORDER — BUPIVACAINE HCL (PF) 0.5 % IJ SOLN
5.0000 mL | Freq: Once | INTRAMUSCULAR | Status: DC
  Administered 2019-12-14: 5 mL

## 2019-12-14 MED ORDER — SODIUM CHLORIDE 0.9 % IV SOLN
INTRAVENOUS | Status: DC | PRN
  Administered 2019-12-14 (×2): 5 mL via EPIDURAL

## 2019-12-14 MED ORDER — FENTANYL CITRATE (PF) 100 MCG/2ML IJ SOLN: INTRAMUSCULAR | Status: DC | PRN

## 2019-12-14 MED ORDER — FENTANYL CITRATE (PF) 100 MCG/2ML IJ SOLN
INTRAMUSCULAR | Status: AC
  Filled 2019-12-14: qty 2

## 2019-12-14 MED ORDER — FENTANYL CITRATE (PF) 100 MCG/2ML IJ SOLN: 25.0000 ug | INTRAMUSCULAR | Status: DC | PRN

## 2019-12-14 MED ORDER — LIDOCAINE HCL (PF) 1 % IJ SOLN
INTRAMUSCULAR | Status: DC | PRN
  Administered 2019-12-14 (×2): 1 mL via SUBCUTANEOUS

## 2019-12-14 MED ORDER — DIPHENHYDRAMINE HCL 50 MG/ML IJ SOLN: 12.5000 mg | INTRAMUSCULAR | Status: DC | PRN

## 2019-12-14 MED ORDER — SODIUM CHLORIDE 0.9 % IV SOLN
INTRAVENOUS | Status: AC
  Filled 2019-12-14: qty 500

## 2019-12-14 MED ORDER — FENTANYL 2.5 MCG/ML W/ROPIVACAINE 0.15% IN NS 100 ML EPIDURAL (ARMC)
EPIDURAL | Status: AC
  Filled 2019-12-14: qty 100

## 2019-12-14 MED ORDER — SODIUM CHLORIDE 0.9 % IV SOLN
500.0000 mg | Freq: Once | INTRAVENOUS | Status: AC
  Administered 2019-12-14: 500 mg via INTRAVENOUS

## 2019-12-14 MED ORDER — SOD CITRATE-CITRIC ACID 500-334 MG/5ML PO SOLN
30.0000 mL | ORAL | Status: AC
  Administered 2019-12-14: 30 mL via ORAL

## 2019-12-14 MED ORDER — NALOXONE HCL 4 MG/10ML IJ SOLN
1.0000 ug/kg/h | INTRAVENOUS | Status: DC | PRN
  Filled 2019-12-14: qty 5

## 2019-12-14 MED ORDER — FENTANYL 2.5 MCG/ML W/ROPIVACAINE 0.15% IN NS 100 ML EPIDURAL (ARMC)
12.0000 mL/h | EPIDURAL | Status: DC
  Administered 2019-12-14 (×2): 12 mL/h via EPIDURAL
  Filled 2019-12-14: qty 100

## 2019-12-14 MED ORDER — LIDOCAINE HCL (PF) 2 % IJ SOLN
INTRAMUSCULAR | Status: AC
  Filled 2019-12-14: qty 10

## 2019-12-14 MED ORDER — CARBOPROST TROMETHAMINE 250 MCG/ML IM SOLN
INTRAMUSCULAR | Status: AC
  Filled 2019-12-14: qty 1

## 2019-12-14 MED ORDER — NALBUPHINE HCL 10 MG/ML IJ SOLN: 5.0000 mg | INTRAMUSCULAR | Status: DC | PRN

## 2019-12-14 MED ORDER — FENTANYL CITRATE (PF) 100 MCG/2ML IJ SOLN
INTRAMUSCULAR | Status: DC | PRN
  Administered 2019-12-14: 100 ug via EPIDURAL

## 2019-12-14 MED ORDER — DEXTROSE 5 % IV SOLN
3.0000 g | INTRAVENOUS | Status: AC
  Administered 2019-12-14: 3 g via INTRAVENOUS
  Filled 2019-12-14: qty 3

## 2019-12-14 MED ORDER — MORPHINE SULFATE (PF) 0.5 MG/ML IJ SOLN
INTRAMUSCULAR | Status: DC | PRN
  Administered 2019-12-14: 3 mg via EPIDURAL

## 2019-12-14 MED ORDER — EPHEDRINE 5 MG/ML INJ: 10.0000 mg | INTRAVENOUS | Status: DC | PRN

## 2019-12-14 MED ORDER — FENTANYL CITRATE (PF) 100 MCG/2ML IJ SOLN
INTRAMUSCULAR | Status: DC | PRN
  Administered 2019-12-14: 25 ug via INTRAVENOUS

## 2019-12-14 MED ORDER — MORPHINE SULFATE (PF) 0.5 MG/ML IJ SOLN
INTRAMUSCULAR | Status: AC
  Filled 2019-12-14: qty 10

## 2019-12-14 MED ORDER — SODIUM CHLORIDE 0.9 % IV SOLN
INTRAVENOUS | Status: DC | PRN
  Administered 2019-12-14: 50 ug/min via INTRAVENOUS

## 2019-12-14 MED ORDER — BUPIVACAINE HCL (PF) 0.5 % IJ SOLN
INTRAMUSCULAR | Status: AC
  Filled 2019-12-14: qty 30

## 2019-12-14 MED ORDER — MEPERIDINE HCL 25 MG/ML IJ SOLN: 6.2500 mg | INTRAMUSCULAR | Status: DC | PRN

## 2019-12-14 MED ORDER — LIDOCAINE-EPINEPHRINE (PF) 1.5 %-1:200000 IJ SOLN
INTRAMUSCULAR | Status: DC | PRN
  Administered 2019-12-14: 3 mL via EPIDURAL

## 2019-12-14 MED ORDER — ONDANSETRON HCL 4 MG/2ML IJ SOLN
INTRAMUSCULAR | Status: DC | PRN
  Administered 2019-12-14: 4 mg via INTRAVENOUS

## 2019-12-14 MED ORDER — NALOXONE HCL 0.4 MG/ML IJ SOLN: 0.4000 mg | INTRAMUSCULAR | Status: DC | PRN

## 2019-12-14 MED ORDER — OXYTOCIN-SODIUM CHLORIDE 30-0.9 UT/500ML-% IV SOLN
INTRAVENOUS | Status: DC | PRN
  Administered 2019-12-14: 300 mL via INTRAVENOUS

## 2019-12-14 MED ORDER — NALBUPHINE HCL 10 MG/ML IJ SOLN: 5.0000 mg | Freq: Once | INTRAMUSCULAR | Status: DC | PRN

## 2019-12-14 MED ORDER — KETOROLAC TROMETHAMINE 30 MG/ML IJ SOLN: 30.0000 mg | Freq: Four times a day (QID) | INTRAMUSCULAR | Status: AC | PRN

## 2019-12-14 MED ORDER — BUPIVACAINE 0.25 % ON-Q PUMP DUAL CATH 400 ML
400.0000 mL | INJECTION | Status: DC
  Filled 2019-12-14: qty 400

## 2019-12-14 MED ORDER — ONDANSETRON HCL 4 MG/2ML IJ SOLN: 4.0000 mg | Freq: Once | INTRAMUSCULAR | Status: DC | PRN

## 2019-12-14 MED ORDER — ONDANSETRON HCL 4 MG/2ML IJ SOLN: 4.0000 mg | Freq: Three times a day (TID) | INTRAMUSCULAR | Status: DC | PRN

## 2019-12-14 MED ORDER — DIPHENHYDRAMINE HCL 25 MG PO CAPS: 25.0000 mg | ORAL_CAPSULE | ORAL | Status: DC | PRN

## 2019-12-14 MED ORDER — LIDOCAINE HCL (PF) 2 % IJ SOLN
INTRAMUSCULAR | Status: DC | PRN
  Administered 2019-12-14 (×2): 60 mg via EPIDURAL
  Administered 2019-12-14 (×3): 100 mg via EPIDURAL

## 2019-12-14 MED ORDER — ACETAMINOPHEN 500 MG PO TABS
1000.0000 mg | ORAL_TABLET | Freq: Four times a day (QID) | ORAL | Status: AC
  Administered 2019-12-14 – 2019-12-15 (×4): 1000 mg via ORAL
  Filled 2019-12-14 (×4): qty 2

## 2019-12-14 MED ORDER — BUPIVACAINE HCL (PF) 0.5 % IJ SOLN
INTRAMUSCULAR | Status: DC | PRN
  Administered 2019-12-14: 10 mL

## 2019-12-14 MED ORDER — KETOROLAC TROMETHAMINE 30 MG/ML IJ SOLN
30.0000 mg | Freq: Four times a day (QID) | INTRAMUSCULAR | Status: AC | PRN
  Administered 2019-12-15: 30 mg via INTRAVENOUS
  Filled 2019-12-14: qty 1

## 2019-12-14 MED ORDER — SODIUM CHLORIDE 0.9% FLUSH: 3.0000 mL | INTRAVENOUS | Status: DC | PRN

## 2019-12-14 SURGICAL SUPPLY — 38 items
CANISTER SUCT 3000ML PPV (MISCELLANEOUS) ×3 IMPLANT
CATH KIT ON-Q SILVERSOAK 5IN (CATHETERS) ×6 IMPLANT
CLOSURE WOUND 1/2 X4 (GAUZE/BANDAGES/DRESSINGS) ×1
COVER WAND RF STERILE (DRAPES) ×3 IMPLANT
DERMABOND ADVANCED (GAUZE/BANDAGES/DRESSINGS) ×2
DERMABOND ADVANCED .7 DNX12 (GAUZE/BANDAGES/DRESSINGS) ×1 IMPLANT
DRSG OPSITE POSTOP 4X10 (GAUZE/BANDAGES/DRESSINGS) ×3 IMPLANT
DRSG TELFA 3X8 NADH (GAUZE/BANDAGES/DRESSINGS) ×3 IMPLANT
ELECT CAUTERY BLADE 6.4 (BLADE) ×3 IMPLANT
ELECT REM PT RETURN 9FT ADLT (ELECTROSURGICAL) ×3
ELECTRODE REM PT RTRN 9FT ADLT (ELECTROSURGICAL) ×1 IMPLANT
EXTRT SYSTEM ALEXIS 17CM (MISCELLANEOUS) ×3
GAUZE SPONGE 4X4 12PLY STRL (GAUZE/BANDAGES/DRESSINGS) ×3 IMPLANT
GLOVE BIO SURGEON STRL SZ7 (GLOVE) ×3 IMPLANT
GLOVE INDICATOR 7.5 STRL GRN (GLOVE) ×3 IMPLANT
GOWN STRL REUS W/ TWL LRG LVL3 (GOWN DISPOSABLE) ×3 IMPLANT
GOWN STRL REUS W/TWL LRG LVL3 (GOWN DISPOSABLE) ×6
KIT PREVENA INCISION MGT20CM45 (CANNISTER) ×3 IMPLANT
NS IRRIG 1000ML POUR BTL (IV SOLUTION) ×3 IMPLANT
PACK C SECTION AR (MISCELLANEOUS) ×3 IMPLANT
PAD OB MATERNITY 4.3X12.25 (PERSONAL CARE ITEMS) ×6 IMPLANT
PAD PREP 24X41 OB/GYN DISP (PERSONAL CARE ITEMS) ×3 IMPLANT
PENCIL SMOKE ULTRAEVAC 22 CON (MISCELLANEOUS) ×3 IMPLANT
PREVENA RESTOR AXIOFORM 29X28 (GAUZE/BANDAGES/DRESSINGS) ×3 IMPLANT
RETRACTOR TRAXI PANNICULUS (MISCELLANEOUS) ×1 IMPLANT
STRIP CLOSURE SKIN 1/2X4 (GAUZE/BANDAGES/DRESSINGS) ×2 IMPLANT
SUT MNCRL 4-0 (SUTURE) ×2
SUT MNCRL 4-0 27XMFL (SUTURE) ×1
SUT PDS AB 1 TP1 96 (SUTURE) ×3 IMPLANT
SUT PLAIN 2 0 XLH (SUTURE) ×3 IMPLANT
SUT PLAIN GUT 0 (SUTURE) IMPLANT
SUT VIC AB 0 CT1 36 (SUTURE) ×3 IMPLANT
SUT VIC AB 0 CTX 36 (SUTURE) ×4
SUT VIC AB 0 CTX36XBRD ANBCTRL (SUTURE) ×2 IMPLANT
SUTURE MNCRL 4-0 27XMF (SUTURE) ×1 IMPLANT
SWABSTK COMLB BENZOIN TINCTURE (MISCELLANEOUS) ×3 IMPLANT
SYSTEM CONTND EXTRCTN KII BLLN (MISCELLANEOUS) ×1 IMPLANT
TRAXI PANNICULUS RETRACTOR (MISCELLANEOUS) ×2

## 2019-12-14 NOTE — Progress Notes (Signed)
   Subjective:  Doing well no concerns, comfortable epidural in place  Objective:   Vitals: Blood pressure (!) 118/52, pulse 76, temperature 98 F (36.7 C), temperature source Oral, resp. rate 18, height 5\' 5"  (1.651 m), weight (!) 140.2 kg, last menstrual period 03/19/2019, SpO2 93 %. General:  Abdomen: Cervical Exam:  Dilation: 5.5 Effacement (%): 90 Cervical Position: Posterior Station: -1 Presentation: Vertex Exam by:: [email or web address], MD  FHT: 140, moderate, present, absent Toco: q1-59min  Results for orders placed or performed during the hospital encounter of 12/12/19 (from the past 24 hour(s))  Comprehensive metabolic panel     Status: Abnormal   Collection Time: 12/14/19  1:07 AM  Result Value Ref Range   Sodium 135 135 - 145 mmol/L   Potassium 4.1 3.5 - 5.1 mmol/L   Chloride 104 98 - 111 mmol/L   CO2 20 (L) 22 - 32 mmol/L   Glucose, Bld Kelly 70 - 99 mg/dL   BUN 10 6 - 20 mg/dL   Creatinine, Ser 12/16/19 0.44 - 1.00 mg/dL   Calcium 8.4 (L) 8.9 - 10.3 mg/dL   Total Protein 5.8 (L) 6.5 - 8.1 g/dL   Albumin 2.6 (L) 3.5 - 5.0 g/dL   AST 22 15 - 41 U/L   ALT 17 0 - 44 U/L   Alkaline Phosphatase 132 (H) 38 - 126 U/L   Total Bilirubin 0.4 0.3 - 1.2 mg/dL   GFR, Estimated 0.38 >33 mL/min   Anion gap 11 5 - 15  CBC     Status: Abnormal   Collection Time: 12/14/19  1:07 AM  Result Value Ref Range   WBC 11.9 (H) 4.0 - 10.5 K/uL   RBC 4.19 3.87 - 5.11 MIL/uL   Hemoglobin 11.5 (L) 12.0 - 15.0 g/dL   HCT 12/16/19 (L) 36 - 46 %   MCV 85.0 80.0 - 100.0 fL   MCH 27.4 26.0 - 34.0 pg   MCHC 32.3 30.0 - 36.0 g/dL   RDW 32.9 19.1 - 66.0 %   Platelets 187 150 - 400 K/uL   nRBC 0.0 0.0 - 0.2 %    Assessment:   29 y.o. G1P0 [redacted]w[redacted]d IOL preeclampsia without severe features  Plan:   1) Labor - transitioning into active phase.  Continues to make cervical change  2) Fetus - cat I tracing, FSE placed  3) Preeeclampsia - last set of labs normal.  Cr overall stable but will continue to monitor.   BP remain mild range to normotensive  [redacted]w[redacted]d, MD, V[facility] Austria [facility], [facility] 12/14/2019, 6:02 AM

## 2019-12-14 NOTE — Op Note (Signed)
Cesarean Section Operative Note    Patient Name: Kelly Baxter  MRN: [id number]  Date of Surgery: 12/14/2019   Pre-operative Diagnosis:  1) Fetal intolerance of labor 2) intrauterine pregnancy at [redacted]w[redacted]d  3) preeclampsia without severe features.   Post-operative Diagnosis:   1) Fetal intolerance of labor 2) intrauterine pregnancy at [redacted]w[redacted]d  3) preeclampsia without severe features.   Procedure: Primary Low Transverse Cesarean section with Pfannenstiel incision with double-layer uterine closure.  Surgeon: Moishe Spice) and Role:    * Conard Novak, MD - Primary   Assistants: Tresea Mall, CNM; No other capable assistant available, in surgery requiring high level assistant.  Anesthesia: epidural   Findings:  1) normal appearing gravid uterus, fallopian tubes, and ovaries 2) viable female infant with weight of 3,440 grams, APGARs 8 and 8   Quantified Blood Loss: 650 mL  Total IV Fluids: 1,1000 ml   Urine Output: 300 mL clear urine at end of procedure  Specimens: none  Complications: no complications  Disposition: PACU - hemodynamically stable.   Maternal Condition: stable   Baby condition / location:  Couplet care / Skin to Skin  Procedure Details:  The patient was seen in the Holding Room. The risks, benefits, complications, treatment options, and expected outcomes were discussed with the patient. The patient concurred with the proposed plan, giving informed consent. identified as Kelly Baxter and the procedure verified as C-Section Delivery. A Time Out was held and the above information confirmed.   After induction of anesthesia, the patient was draped and prepped in the usual sterile manner. A Pfannenstiel incision was made and carried down through the subcutaneous tissue to the fascia. Fascial incision was made and extended transversely. The fascia was separated from the underlying rectus tissue superiorly and inferiorly. The peritoneum was identified and  entered. Peritoneal incision was extended longitudinally. An Alexis retractor was placed with great care not to include bowel.  The bladder flap was not bluntly or sharply freed from the lower uterine segment. A low transverse uterine incision was made and the hysterotomy was extended with cranial-caudal tension. Delivered from cephalic presentation was a 3,440 gram Living newborn infant(s) or Female with Apgar scores of 8 at one minute and 8 at five minutes. Cord ph was not sent the umbilical cord was clamped and cut cord blood was obtained for evaluation. The placenta was removed Intact and appeared normal. The uterine outline, tubes and ovaries appeared normal. The uterine incision was closed with running locked sutures of 0 Vicryl.  A second layer of the same suture was thrown in an imbricating fashion.  Hemostasis was assured.  The uterus was returned to the abdomen and the paracolic gutters were cleared of all clots and debris.  The Alexis retractor was removed.  The rectus muscles were inspected and found to be hemostatic.  Due to bowel protrusion obscuring the field, the peritoneum was closed in a running fashion.   The On-Q catheter pumps were inserted in accordance with the manufacturer's recommendations.  The catheters were inserted approximately 4cm cephelad to the incision line, approximately 1cm apart, straddling the midline.  They were inserted to a depth of the 4th mark. They were positioned superficial to the rectus abdominus muscles and deep to the rectus fascia.    The fascia was then reapproximated with running sutures of 1-0 PDS, looped. The subcutaneous tissue was reapproximated using 2-0 plain gut such that no greater than 2 cm of dead space remained. The subcuticular closure was performed using 4-0  monocryl.   The On-Q catheters were bolused with 5 mL of 0.5% marcaine plain for a total of 10 mL.  The catheters were affixed to the skin with surgical skin glue, steri-strips, and tegaderm.     The Prevena wound vac was placed in accordance to the manufacturers recommendations.  A seal was verified and the vac was placed under suction tension using the manufacturer's cannister and suction.    The surgical assistant performed tissue retraction, assistance with suturing, and fundal pressure.    Instrument, sponge, and needle counts were correct prior the abdominal closure and were correct at the conclusion of the case.  The patient received Ancef 3 gram IV and Azithromycin 500 mg IV prior to skin incision (within 30 minutes). She also had a vaginal prep performed prior to the beginning of surgery with three betadine swabs.  For VTE prophylaxis she was wearing SCDs throughout the case.  The assistant surgeon was a CNM due to lack of availability of another Sales promotion account executive.    Signed: Conard Novak, MD 12/14/2019 6:05 PM

## 2019-12-14 NOTE — Progress Notes (Signed)
   Subjective:  No painfully contracting following AROM and reporting pressure  Objective:   Vitals: Blood pressure 131/79, pulse 67, temperature 98 F (36.7 C), temperature source Oral, resp. rate 18, height 5\' 5"  (1.651 m), weight (!) 140.2 kg, last menstrual period 03/19/2019, SpO2 99 %. General:  Abdomen: Cervical Exam:  Dilation: 4 Effacement (%): 80 Cervical Position: Posterior Station: -1 Presentation: Vertex Exam by:: Tavarious Freel MD  FHT: 135, moderate, +accles, no decels Toco:q1-60min  Results for orders placed or performed during the hospital encounter of 12/12/19 (from the past 24 hour(s))  Comprehensive metabolic panel     Status: Abnormal   Collection Time: 12/13/19  2:36 AM  Result Value Ref Range   Sodium 138 135 - 145 mmol/L   Potassium 4.1 3.5 - 5.1 mmol/L   Chloride 107 98 - 111 mmol/L   CO2 22 22 - 32 mmol/L   Glucose, Bld 85 70 - 99 mg/dL   BUN 10 6 - 20 mg/dL   Creatinine, Ser 12/15/19 0.44 - 1.00 mg/dL   Calcium 8.2 (L) 8.9 - 10.3 mg/dL   Total Protein 5.5 (L) 6.5 - 8.1 g/dL   Albumin 2.4 (L) 3.5 - 5.0 g/dL   AST 23 15 - 41 U/L   ALT 17 0 - 44 U/L   Alkaline Phosphatase 129 (H) 38 - 126 U/L   Total Bilirubin 0.5 0.3 - 1.2 mg/dL   GFR, Estimated 2.50 >53 mL/min   Anion gap 9 5 - 15  CBC     Status: Abnormal   Collection Time: 12/13/19  2:36 AM  Result Value Ref Range   WBC 10.5 4.0 - 10.5 K/uL   RBC 3.88 3.87 - 5.11 MIL/uL   Hemoglobin 10.6 (L) 12.0 - 15.0 g/dL   HCT 12/15/19 (L) 36 - 46 %   MCV 86.1 80.0 - 100.0 fL   MCH 27.3 26.0 - 34.0 pg   MCHC 31.7 30.0 - 36.0 g/dL   RDW 67.3 41.9 - 37.9 %   Platelets 158 150 - 400 K/uL   nRBC 0.2 0.0 - 0.2 %  CBC     Status: Abnormal   Collection Time: 12/14/19  1:07 AM  Result Value Ref Range   WBC 11.9 (H) 4.0 - 10.5 K/uL   RBC 4.19 3.87 - 5.11 MIL/uL   Hemoglobin 11.5 (L) 12.0 - 15.0 g/dL   HCT 12/16/19 (L) 36 - 46 %   MCV 85.0 80.0 - 100.0 fL   MCH 27.4 26.0 - 34.0 pg   MCHC 32.3 30.0 - 36.0 g/dL   RDW  09.7 35.3 - 29.9 %   Platelets 187 150 - 400 K/uL   nRBC 0.0 0.0 - 0.2 %    Assessment:   29 y.o. G1P0 [redacted]w[redacted]d IOL preeclampsia without severe features  Plan:   1) Labor - good change in station and effacement, now painfully contracting may have epidural   2) Fetus - cat I tracing  3) Preeclampsia without severe features - BP remain mild range, repeat labs pending.  Only lab abnormality significant proteinuria  [redacted]w[redacted]d, MD, [facility], [facility] 12/14/2019, 1:39 AM

## 2019-12-14 NOTE — Anesthesia Preprocedure Evaluation (Signed)
Anesthesia Evaluation  Patient identified by MRN, date of birth, ID band Patient awake    Reviewed: Allergy & Precautions, H&P , NPO status , Patient's Chart, lab work & pertinent test results  History of Anesthesia Complications Negative for: history of anesthetic complications  Airway Mallampati: III  TM Distance: >3 FB Neck ROM: full    Dental  (+) Chipped   Pulmonary neg pulmonary ROS,    Pulmonary exam normal        Cardiovascular Exercise Tolerance: Good hypertension, Normal cardiovascular exam     Neuro/Psych PSYCHIATRIC DISORDERS    GI/Hepatic negative GI ROS, GERD  Medicated and Controlled,  Endo/Other  Morbid obesity  Renal/GU   negative genitourinary   Musculoskeletal   Abdominal   Peds  Hematology negative hematology ROS (+)   Anesthesia Other Findings Patient reports scoliosis   Past Medical History: No date: Adult BMI 40.0-44.9 kg/sq m (HCC) No date: Anxiety No date: GERD (gastroesophageal reflux disease) No date: Herpes No date: Obesity affecting pregnancy  Past Surgical History: No date: NO PAST SURGERIES  BMI    Body Mass Index: 51.42 kg/m      Reproductive/Obstetrics (+) Pregnancy                             Anesthesia Physical Anesthesia Plan  ASA: III  Anesthesia Plan: Epidural   Post-op Pain Management:    Induction:   PONV Risk Score and Plan:   Airway Management Planned: Natural Airway  Additional Equipment:   Intra-op Plan:   Post-operative Plan:   Informed Consent: I have reviewed the patients History and Physical, chart, labs and discussed the procedure including the risks, benefits and alternatives for the proposed anesthesia with the patient or authorized representative who has indicated his/her understanding and acceptance.     Dental Advisory Given  Plan Discussed with: Anesthesiologist  Anesthesia Plan Comments: (Patient  reports no bleeding problems and no anticoagulant use.   Patient consented for risks of anesthesia including but not limited to:  - adverse reactions to medications - risk of bleeding, infection and or nerve damage from epidural that could lead to paralysis - risk of headache or failed epidural - nerve damage due to positioning - Damage to heart, brain, lungs, other parts of body or loss of life  Patient voiced understanding.)        Anesthesia Quick Evaluation

## 2019-12-14 NOTE — Progress Notes (Signed)
  Labor Progress Note   29 y.o. G1P0 @ [redacted]w[redacted]d , admitted for  Pregnancy, Labor Management.   Subjective:  Comfortable with epidural.  Objective:  BP 140/72   Pulse 66   Temp 98.1 F (36.7 C) (Oral)   Resp 16   Ht 5\' 5"  (1.651 m)   Wt (!) 140.2 kg   LMP 03/19/2019   SpO2 94%   BMI 51.42 kg/m  Abd: gravid, ND, FHT present, mild tenderness on exam Extr: trace to 1+ bilateral pedal edema SVE: CERVIX: 8.5 cm dilated, 100 effaced, +1 station Previous exam likely inaccurate due to body habitus FSE replaced  EFM: FHR: 145 bpm, variability: moderate,  accelerations:  Present,  decelerations:  Present variable and early decelerations Toco: Frequency: Every 2-5 minutes Labs: I have reviewed the patient's lab results.   Assessment & Plan:  G1P0 @ [redacted]w[redacted]d, admitted for  Pregnancy and Labor/Delivery Management  1. Pain management: epidural. 2. FWB: FHT category II and overall reassuring.  3. ID: GBS negative 4. Labor management: position changes, titrate pitocin up as tolerated by fetus    [redacted]w[redacted]d, CNM [facility] 12/14/2019  11:07 AM

## 2019-12-14 NOTE — Progress Notes (Signed)
   Subjective:  Not feeling much in the way of contractions.  Other than swelling comfortable  Objective:   Vitals: Blood pressure 131/79, pulse 67, temperature 98 F (36.7 C), temperature source Oral, resp. rate 18, height 5\' 5"  (1.651 m), weight (!) 140.2 kg, last menstrual period 03/19/2019, SpO2 99 %. General: NAD Abdomen: gravid, non-tender Cervical Exam:  Dilation: 3 Effacement (%): 70 Cervical Position: Posterior Station: -3 Presentation: Vertex Exam by:: Thuy Atilano MD  AROM clear  FHT: 135, moderate, +accels, no decels Toco:q1-27min  Results for orders placed or performed during the hospital encounter of 12/12/19 (from the past 24 hour(s))  Comprehensive metabolic panel     Status: Abnormal   Collection Time: 12/13/19  2:36 AM  Result Value Ref Range   Sodium 138 135 - 145 mmol/L   Potassium 4.1 3.5 - 5.1 mmol/L   Chloride 107 98 - 111 mmol/L   CO2 22 22 - 32 mmol/L   Glucose, Bld 85 70 - 99 mg/dL   BUN 10 6 - 20 mg/dL   Creatinine, Ser 12/15/19 0.44 - 1.00 mg/dL   Calcium 8.2 (L) 8.9 - 10.3 mg/dL   Total Protein 5.5 (L) 6.5 - 8.1 g/dL   Albumin 2.4 (L) 3.5 - 5.0 g/dL   AST 23 15 - 41 U/L   ALT 17 0 - 44 U/L   Alkaline Phosphatase 129 (H) 38 - 126 U/L   Total Bilirubin 0.5 0.3 - 1.2 mg/dL   GFR, Estimated 1.01 >75 mL/min   Anion gap 9 5 - 15  CBC     Status: Abnormal   Collection Time: 12/13/19  2:36 AM  Result Value Ref Range   WBC 10.5 4.0 - 10.5 K/uL   RBC 3.88 3.87 - 5.11 MIL/uL   Hemoglobin 10.6 (L) 12.0 - 15.0 g/dL   HCT 12/15/19 (L) 36 - 46 %   MCV 86.1 80.0 - 100.0 fL   MCH 27.3 26.0 - 34.0 pg   MCHC 31.7 30.0 - 36.0 g/dL   RDW 25.8 52.7 - 78.2 %   Platelets 158 150 - 400 K/uL   nRBC 0.2 0.0 - 0.2 %    Assessment:   29 y.o. G1P0 [redacted]w[redacted]d IOL preeclampsia without severe features  Plan:   1) Labor - AROM, continue pitocin, IUPC placed to aid in monitoring contractions  2) Fetus - cat I tracing  3) Peeclampsia without severe features  - monitor BP -  repeat CBC, CMP  4) DVT ppx - SCD once epidural in place, currently still ambulating  [redacted]w[redacted]d, MD, [facility], [facility] 12/14/2019, 12:26 AM

## 2019-12-14 NOTE — Progress Notes (Signed)
Spoke to CNM about decrease in variability despite position change and turning pitocin back to 13 milli-units. CNM reviewed FHR strip and discussed plan of rechecking patient/s cervix at 1045. Patient and family updated with plan.

## 2019-12-14 NOTE — Discharge Summary (Signed)
Postpartum Discharge Summary   Patient Name: Kelly Baxter DOB: February 07, 1991 MRN: [id number]  Date of admission: 12/12/2019 Delivery date:12/14/2019  Delivering provider: Prentice Docker D  Date of discharge: 12/16/2019  Admitting diagnosis: Hypertension complicating pregnancy [B35.3] [redacted] weeks gestation of pregnancy [Z3A.38] Intrauterine pregnancy: [redacted]w[redacted]d    Secondary diagnosis:  Active Problems:   Supervision of high risk pregnancy in first trimester   Obesity in pregnancy   Genital herpes simplex   Hypertension complicating pregnancy   [redacted] weeks gestation of pregnancy   Hypertension in pregnancy, preeclampsia   Postpartum care following cesarean delivery   Cesarean delivery delivered  Additional problems: none    Discharge diagnosis: Term Pregnancy Delivered and Preeclampsia (mild)                                              Post partum procedures:none Augmentation: AROM, Pitocin, Cytotec and IP Foley Complications: None   Hospital course: Induction of Labor With Cesarean Section   29y.o. yo G1P1001 at 390w4das admitted to the hospital 12/12/2019 for induction of labor. Patient had a labor course significant for induction of labor and reaching 9.5 cm.  She had fetal intolerance of labor at this point and intrauterine resuscitative measures were not successful. The patient went for cesarean section due to fetal intolerance of labor. Delivery details are as follows: Membrane Rupture Time/Date: 12:13 AM ,12/14/2019   Delivery Method:C-Section, Low Transverse  Details of operation can be found in separate operative Note.  Patient had an uncomplicated postpartum course. She is ambulating, tolerating a regular diet, passing flatus, and urinating well.  Patient is discharged home in stable condition on 12/16/19.    She will be discharged home on lovenox 30 mg BID for 6 weeks.   Newborn Data: Birth date:12/14/2019  Birth time:1:46 PM  Gender:Female  Living status:Living    Apgars:8 ,8  Weight:3440 g                                 Magnesium Sulfate received: No BMZ received: No Rhophylac:No MMR:No T-DaP:Given prenatally on 10/18/2019 Transfusion:No  Physical exam  Vitals:   12/15/19 2021 12/16/19 0035 12/16/19 0408 12/16/19 0750  BP: 131/85 139/82 135/80 134/82  Pulse: 80 64 69 61  Resp: _0 Temp: 98 F (36.7 C) 98.4 F (36.9 C) 98.2 F (36.8 C) 97.9 F (36.6 C)  TempSrc: Oral Oral Oral   SpO2: 98% 98% 98% 99%  Weight:      Height:       General: alert, cooperative and no distress Lochia: appropriate Uterine Fundus: firm Incision: Wound vac operating, On Q pump intact, Dressings C/D/I DVT Evaluation: No evidence of DVT seen on physical exam. Labs: Lab Results  Component Value Date   WBC 14.7 (H) 12/15/2019   HGB 10.2 (L) 12/15/2019   HCT 32.2 (L) 12/15/2019   MCV 86.1 12/15/2019   PLT 153 12/15/2019   CMP Latest Ref Rng & Units 12/15/2019  Glucose 70 - 99 mg/dL -  BUN 6 - 20 mg/dL -  Creatinine 0.44 - 1.00 mg/dL 1.28(H)  Sodium 135 - 145 mmol/L -  Potassium 3.5 - 5.1 mmol/L -  Chloride 98 - 111 mmol/L -  CO2 22 - 32 mmol/L -  Calcium 8.9 - 10.3  mg/dL -  Total Protein 6.5 - 8.1 g/dL -  Total Bilirubin 0.3 - 1.2 mg/dL -  Alkaline Phos 38 - 126 U/L -  AST 15 - 41 U/L -  ALT 0 - 44 U/L -   Edinburgh Score: Edinburgh Postnatal Depression Scale Screening Tool 12/15/2019  I have been able to laugh and see the funny side of things. 0  I have looked forward with enjoyment to things. 0  I have blamed myself unnecessarily when things went wrong. 1  I have been anxious or worried for no good reason. 2  I have felt scared or panicky for no good reason. 0  Things have been getting on top of me. 0  I have been so unhappy that I have had difficulty sleeping. 0  I have felt sad or miserable. 0  I have been so unhappy that I have been crying. 0  The thought of harming myself has occurred to me. 0  Edinburgh Postnatal  Depression Scale Total 3      After visit meds:  Allergies as of 12/16/2019      Reactions   Aluminum Hives, Rash   Aluminum-containing Compounds Hives   Other    Trees, weeds, grass, dust mites.      Medication List    STOP taking these medications   pantoprazole 40 MG tablet Commonly known as: PROTONIX   valACYclovir 500 MG tablet Commonly known as: VALTREX     TAKE these medications   acyclovir 400 MG tablet Commonly known as: ZOVIRAX Take 400 mg by mouth as needed.   docusate sodium 100 MG capsule Commonly known as: COLACE Take 100 mg by mouth daily as needed for mild constipation.   enoxaparin 30 MG/0.3ML injection Commonly known as: LOVENOX Inject 0.3 mLs (30 mg total) into the skin every 12 (twelve) hours.   fluticasone 50 MCG/ACT nasal spray Commonly known as: FLONASE Place 2 sprays into both nostrils daily. As needed for nasal congestion   multivitamin-prenatal 27-0.8 MG Tabs tablet Take 1 tablet by mouth daily at 12 noon.   oxyCODONE 5 MG immediate release tablet Commonly known as: Oxy IR/ROXICODONE Take 1 tablet (5 mg total) by mouth every 6 (six) hours as needed for up to 5 days for severe pain.            Discharge Care Instructions  (From admission, onward)         Start     Ordered   12/16/19 0000  Discharge wound care:       Comments: Keep incision dry, clean.   12/16/19 1040           Discharge home in stable condition Infant Feeding: formula Infant Disposition:home with mother Discharge instruction: per After Visit Summary and Postpartum booklet. Activity: Advance as tolerated. Pelvic rest for 6 weeks.  Diet: routine diet Anticipated Birth Control: OCPs Postpartum Appointment:1 week Additional Postpartum F/U: Incision check 1 week and BP check 1 week Future Appointments: Future Appointments  Date Time Provider [place]  12/17/2019  2:00 PM [facility] [place] 2 [facility] None  12/17/2019  2:30 PM Gae Dry, MD [facility]  None   Follow up Visit:  Follow-up Information    Will Bonnet, MD. Schedule an appointment as soon as possible for a visit in 1 week(s).   Specialty: Obstetrics and Gynecology Why: For incision and blood pressure check Contact information: [address] [place] [address] [phone]  12/16/2019 Rod Can, CNM

## 2019-12-14 NOTE — Progress Notes (Signed)
  Labor Progress Note   29 y.o. G1P0 @ [redacted]w[redacted]d , admitted for  Pregnancy, Labor Management.   Subjective:  Comfortable with epidural. She has been feeling some rectal pressure.  Objective:  BP 117/65   Pulse 64   Temp 99.5 F (37.5 C) (Oral)   Resp 16   Ht 5\' 5"  (1.651 m)   Wt (!) 140.2 kg   LMP 03/19/2019   SpO2 94%   BMI 51.42 kg/m  Abd: gravid, ND, FHT present, mild tenderness on exam Extr: trace to 1+ bilateral pedal edema SVE: CERVIX: 9.5 cm dilated, 100 effaced, +1 station  EFM: FHR: 145 bpm, variability: moderate,  accelerations:  Absent,  decelerations:  Absent Toco: Frequency: Every 1.5-3 minutes, IUPC not indicating adequate, however there is good cervical change Labs: I have reviewed the patient's lab results.   Assessment & Plan:  G1P0 @ [redacted]w[redacted]d, admitted for  Pregnancy and Labor/Delivery Management  1. Pain management: epidural. 2. FWB: FHT category II and overall reassuring.  3. ID: GBS negative 4. Labor management: titrate pitocin down for fetal variability, labor down  All discussed with patient, see orders   [redacted]w[redacted]d, CNM [facility] 12/14/2019  8:55 AM

## 2019-12-14 NOTE — Transfer of Care (Signed)
Immediate Anesthesia Transfer of Care Note  Patient: Kelly Baxter  Procedure(s) Performed: CESAREAN SECTION  Patient Location: PACU and L&D  Anesthesia Type:Epidural  Level of Consciousness: awake, alert  and oriented  Airway & Oxygen Therapy: Patient Spontanous Breathing  Post-op Assessment: Report given to RN and Post -op Vital signs reviewed and stable  Post vital signs: Reviewed and stable  Last Vitals:  Vitals Value Taken Time  BP 144/78 12/14/19 1505  Temp 36.8 C 12/14/19 1500  Pulse 77 12/14/19 1510  Resp 27 12/14/19 1510  SpO2 96 % 12/14/19 1510  Vitals shown include unvalidated device data.  Last Pain:  Vitals:   12/14/19 1500  TempSrc: Oral  PainSc:       Patients Stated Pain Goal: 0 (12/14/19 0900)  Complications: No complications documented.

## 2019-12-14 NOTE — Progress Notes (Signed)
Labor Check  Subj:  Complaints: comfortable with epidural Called by CNM due to deep decelerations with contractions, once to about 40 bpm.  Slow return to baseline with minimal variability after return to baseline.    Obj:  BP 140/80   Pulse 66   Temp 98.1 F (36.7 C) (Oral)   Resp 16   Ht 5\' 5"  (1.651 m)   Wt (!) 140.2 kg   LMP 03/19/2019   SpO2 98%   BMI 51.42 kg/m  Dose (milli-units/min) Oxytocin: 17 milli-units/min  Cervix: Dilation: Lip/rim / Effacement (%): 100 / Station: Plus 1  Baseline FHR: 145 beats/min   Variability: moderate   Accelerations: absent   Decelerations: present, early this morning, mild late decelerations that resolved with positional changes, then around 1155 AM she had a deceleration to 60 with quick return to baseline, then down again to 80, then back up quickly, then back down to 90 bpm.  Several minutes of normal baseline then down to 40 bpm and slowly back up over several minutes to 150.  Good reaction to fetal scalp stimulation.  However, return to minimal variability most of the time.   Contractions: present frequency: now less due to discontinuation of pitocin Overall assessment: cat 2.    Female chaperone present for pelvic exam:   A/P: 29 y.o. G1P0 female at [redacted]w[redacted]d with IOL for mild preeclampsia.  1.  Labor: fetal intolerance of labor.  To OR for c-section.  2.  FWB: guarded with minimal variability and decelerations as noted above, Overall assessment: category 2  3.  GBS negative  4.  Pain: epidural 5.  Called c-section. Initially discussed continuing to see if we can finesse delivery with very small increases in pitocin dosing and pushing when able.  However, variability now overall minimal with periods of moderate.  Patient consented by me with risks/benefits discussed.   [redacted]w[redacted]d, MD, Thomas[facility], [facility] 12/14/2019 12:44 PM

## 2019-12-14 NOTE — Anesthesia Procedure Notes (Signed)
Epidural Patient location during procedure: OB Start time: 12/14/2019 2:06 AM End time: 12/14/2019 2:16 AM  Staffing Anesthesiologist: Brandi Armato, Cleda Mccreedy, MD Performed: anesthesiologist   Preanesthetic Checklist Completed: patient identified, IV checked, site marked, risks and benefits discussed, surgical consent, monitors and equipment checked, pre-op evaluation and timeout performed  Epidural Patient position: sitting Prep: ChloraPrep Patient monitoring: heart rate, continuous pulse ox and blood pressure Approach: midline Location: L3-L4 Injection technique: LOR saline  Needle:  Needle type: Tuohy  Needle gauge: 17 G Needle length: 9 cm and 9 Needle insertion depth: 7 cm Catheter type: closed end flexible Catheter size: 19 Gauge Catheter at skin depth: 13 cm Test dose: negative and 1.5% lidocaine with Epi 1:200 K  Assessment Sensory level: T10 Events: blood not aspirated, injection not painful, no injection resistance, no paresthesia and negative IV test  Additional Notes 2 attempts Pt. Evaluated and documentation done after procedure finished. Patient identified. Risks/Benefits/Options discussed with patient including but not limited to bleeding, infection, nerve damage, paralysis, failed block, incomplete pain control, headache, blood pressure changes, nausea, vomiting, reactions to medication both or allergic, itching and postpartum back pain. Confirmed with bedside nurse the patient's most recent platelet count. Confirmed with patient that they are not currently taking any anticoagulation, have any bleeding history or any family history of bleeding disorders. Patient expressed understanding and wished to proceed. All questions were answered. Sterile technique was used throughout the entire procedure. Please see nursing notes for vital signs. Test dose was given through epidural catheter and negative prior to continuing to dose epidural or start infusion. Warning signs of  high block given to the patient including shortness of breath, tingling/numbness in hands, complete motor block, or any concerning symptoms with instructions to call for help. Patient was given instructions on fall risk and not to get out of bed. All questions and concerns addressed with instructions to call with any issues or inadequate analgesia.   Patient tolerated the insertion well without immediate complications.Reason for block:procedure for pain

## 2019-12-15 LAB — CBC
HCT: 32.2 % — ABNORMAL LOW (ref 36.0–46.0)
Hemoglobin: 10.2 g/dL — ABNORMAL LOW (ref 12.0–15.0)
MCH: 27.3 pg (ref 26.0–34.0)
MCHC: 31.7 g/dL (ref 30.0–36.0)
MCV: 86.1 fL (ref 80.0–100.0)
Platelets: 153 10*3/uL (ref 150–400)
RBC: 3.74 MIL/uL — ABNORMAL LOW (ref 3.87–5.11)
RDW: 15.4 % (ref 11.5–15.5)
WBC: 14.7 10*3/uL — ABNORMAL HIGH (ref 4.0–10.5)
nRBC: 0 % (ref 0.0–0.2)

## 2019-12-15 LAB — CREATININE, SERUM
Creatinine, Ser: 1.28 mg/dL — ABNORMAL HIGH (ref 0.44–1.00)
GFR, Estimated: 58 mL/min — ABNORMAL LOW (ref >60)

## 2019-12-15 MED ORDER — COCONUT OIL OIL: 1.0000 "application " | TOPICAL_OIL | Status: DC | PRN

## 2019-12-15 MED ORDER — IBUPROFEN 600 MG PO TABS
600.0000 mg | ORAL_TABLET | Freq: Four times a day (QID) | ORAL | Status: DC
  Administered 2019-12-15 – 2019-12-16 (×5): 600 mg via ORAL
  Filled 2019-12-15 (×5): qty 1

## 2019-12-15 MED ORDER — MENTHOL 3 MG MT LOZG
1.0000 | LOZENGE | OROMUCOSAL | Status: DC | PRN
  Filled 2019-12-15: qty 9

## 2019-12-15 MED ORDER — DIBUCAINE (PERIANAL) 1 % EX OINT: 1.0000 "application " | TOPICAL_OINTMENT | CUTANEOUS | Status: DC | PRN

## 2019-12-15 MED ORDER — LACTATED RINGERS IV SOLN: INTRAVENOUS | Status: DC

## 2019-12-15 MED ORDER — WITCH HAZEL-GLYCERIN EX PADS: 1.0000 "application " | MEDICATED_PAD | CUTANEOUS | Status: DC | PRN

## 2019-12-15 MED ORDER — ENOXAPARIN SODIUM 30 MG/0.3ML ~~LOC~~ SOLN
30.0000 mg | Freq: Two times a day (BID) | SUBCUTANEOUS | Status: DC
  Administered 2019-12-15 – 2019-12-16 (×2): 30 mg via SUBCUTANEOUS
  Filled 2019-12-15 (×3): qty 0.3

## 2019-12-15 MED ORDER — OXYCODONE HCL 5 MG PO TABS
5.0000 mg | ORAL_TABLET | Freq: Four times a day (QID) | ORAL | Status: DC | PRN
  Administered 2019-12-15 – 2019-12-16 (×2): 5 mg via ORAL
  Filled 2019-12-15 (×2): qty 1

## 2019-12-15 MED ORDER — PRENATAL MULTIVITAMIN CH
1.0000 | ORAL_TABLET | Freq: Every day | ORAL | Status: DC
  Administered 2019-12-15: 1 via ORAL
  Filled 2019-12-15: qty 1

## 2019-12-15 MED ORDER — ACETAMINOPHEN 500 MG PO TABS
1000.0000 mg | ORAL_TABLET | Freq: Four times a day (QID) | ORAL | Status: DC | PRN
  Administered 2019-12-15 – 2019-12-16 (×2): 1000 mg via ORAL
  Filled 2019-12-15 (×2): qty 2

## 2019-12-15 MED ORDER — FERROUS SULFATE 325 (65 FE) MG PO TABS
325.0000 mg | ORAL_TABLET | Freq: Two times a day (BID) | ORAL | Status: DC
  Administered 2019-12-15 – 2019-12-16 (×3): 325 mg via ORAL
  Filled 2019-12-15 (×3): qty 1

## 2019-12-15 MED ORDER — OXYTOCIN-SODIUM CHLORIDE 30-0.9 UT/500ML-% IV SOLN: 2.5000 [IU]/h | INTRAVENOUS | Status: DC

## 2019-12-15 MED ORDER — SIMETHICONE 80 MG PO CHEW
80.0000 mg | CHEWABLE_TABLET | Freq: Three times a day (TID) | ORAL | Status: DC
  Administered 2019-12-15 – 2019-12-16 (×4): 80 mg via ORAL
  Filled 2019-12-15 (×5): qty 1

## 2019-12-15 MED ORDER — SENNOSIDES-DOCUSATE SODIUM 8.6-50 MG PO TABS
2.0000 | ORAL_TABLET | ORAL | Status: DC
  Administered 2019-12-15 – 2019-12-16 (×2): 2 via ORAL
  Filled 2019-12-15 (×2): qty 2

## 2019-12-15 MED ORDER — DIPHENHYDRAMINE HCL 25 MG PO CAPS: 25.0000 mg | ORAL_CAPSULE | Freq: Four times a day (QID) | ORAL | Status: DC | PRN

## 2019-12-15 MED ORDER — OXYCODONE-ACETAMINOPHEN 5-325 MG PO TABS
2.0000 | ORAL_TABLET | ORAL | Status: DC | PRN
  Filled 2019-12-15: qty 2

## 2019-12-15 MED ORDER — OXYCODONE-ACETAMINOPHEN 5-325 MG PO TABS: 1.0000 | ORAL_TABLET | ORAL | Status: DC | PRN

## 2019-12-15 NOTE — Progress Notes (Signed)
Obstetric Postpartum/PostOperative Daily Progress Note Subjective:  29 y.o. G1P1001 post-operative day # 1 status post primary cesarean delivery.  She is ambulating, is tolerating po, is voiding spontaneously.  Her pain is well controlled on PO pain medications and On Q pump. Her lochia is less than menses. She denies headache, visual changes, epigastric pain.   Medications SCHEDULED MEDICATIONS   acetaminophen  1,000 mg Oral Q6H   enoxaparin (LOVENOX) injection  30 mg Subcutaneous Q12H   ferrous sulfate  325 mg Oral BID WC   ibuprofen  600 mg Oral Q6H   pantoprazole  40 mg Oral Daily   prenatal multivitamin  1 tablet Oral Q1200   senna-docusate  2 tablet Oral Q24H   simethicone  80 mg Oral TID PC    MEDICATION INFUSIONS   naLOXone (NARCAN) adult infusion for PRURITIS      PRN MEDICATIONS  coconut oil, witch hazel-glycerin **AND** dibucaine, diphenhydrAMINE **OR** diphenhydrAMINE, diphenhydrAMINE, hydrALAZINE **AND** hydrALAZINE **AND** labetalol **AND** labetalol **AND** Measure blood pressure, ketorolac **OR** ketorolac, menthol-cetylpyridinium, nalbuphine **OR** nalbuphine, nalbuphine **OR** nalbuphine, naloxone **AND** sodium chloride flush, naLOXone (NARCAN) adult infusion for PRURITIS, ondansetron (ZOFRAN) IV, oxyCODONE-acetaminophen **OR** oxyCODONE-acetaminophen    Objective:   Vitals:   12/15/19 0400 12/15/19 0430 12/15/19 0430 12/15/19 0726  BP:   124/82 118/76  Pulse: (!) 58 68 74 66  Resp:   20 18  Temp:   98.1 F (36.7 C) 97.6 F (36.4 C)  TempSrc:   Oral Oral  SpO2: 93% 96% 97% 97%  Weight:      Height:        Current Vital Signs 24h Vital Sign Ranges  T 97.6 F (36.4 C) Temp  Avg: 97.9 F (36.6 C)  Min: 97.6 F (36.4 C)  Max: 98.3 F (36.8 C)  BP 118/76 BP  Min: 117/60  Max: 156/62  HR 66 Pulse  Avg: 77  Min: 58  Max: 95  RR 18 Resp  Avg: 16.4  Min: 10  Max: 24  SaO2 97 % Room Air SpO2  Avg: 94.5 %  Min: 91 %  Max: 100 %       24 Hour I/O  Current Shift I/O  Time Ins Outs 10/22 0701 - 10/23 0700 In: 2613.4 [I.V.:2210.3] Out: 4200 [Urine:3550] 10/23 0701 - 10/23 1900 In: -  Out: 300 [Urine:300]  General: NAD Pulmonary: no increased work of breathing Abdomen: non-distended, non-tender, fundus firm at level of umbilicus Inc: Wound Vac functioning normally, On Q intact Extremities: edema decreasing, no erythema, no tenderness  Labs:  Recent Labs  Lab 12/13/19 0236 12/14/19 0107 12/15/19 0837  WBC 10.5 11.9* 14.7*  HGB 10.6* 11.5* 10.2*  HCT 33.4* 35.6* 32.2*  PLT 158 187 153     Assessment:   29 y.o. G1P1001 postoperative day # 1 status post primary cesarean section  Plan:  1) Acute blood loss anemia - hemodynamically stable and asymptomatic - po ferrous sulfate  2) Preeclampsia: primarily normal range BP overnight, continue to observe and start anti-hypertensive as needed  3) O POS / Rubella 6.08 (03/26 1057)/ Varicella Immune  4) TDAP status given antepartum  5) Formula feeding  6) Contraception = oral contraceptives (estrogen/progesterone)  7) Disposition: continue current care   Tresea Mall, CNM 12/15/2019 10:59 AM

## 2019-12-15 NOTE — Anesthesia Postprocedure Evaluation (Signed)
Anesthesia Post Note  Patient: Kelly Baxter  Procedure(s) Performed: CESAREAN SECTION  Patient location during evaluation: Mother Baby Anesthesia Type: Epidural Level of consciousness: awake and alert and oriented Pain management: pain level controlled Vital Signs Assessment: post-procedure vital signs reviewed and stable Respiratory status: spontaneous breathing Cardiovascular status: blood pressure returned to baseline Anesthetic complications: no   No complications documented.   Last Vitals:  Vitals:   12/15/19 0726 12/15/19 1100  BP: 118/76   Pulse: 66 67  Resp: 18   Temp: 36.4 C   SpO2: 97% 95%    Last Pain:  Vitals:   12/15/19 0845  TempSrc:   PainSc: 4                  Taliesin Hartlage

## 2019-12-15 NOTE — Anesthesia Post-op Follow-up Note (Signed)
  Anesthesia Pain Follow-up Note  Patient: Kelly Baxter  Day #: 1  Date of Follow-up: 12/15/2019 Time: 1:19 PM  Last Vitals:  Vitals:   12/15/19 0726 12/15/19 1100  BP: 118/76   Pulse: 66 67  Resp: 18   Temp: 36.4 C   SpO2: 97% 95%    Level of Consciousness: alert  Pain: none   Side Effects:None  Catheter Site Exam:clean, dry, no drainage  Anti-Coag Meds (From admission, onward)   Start     Dose/Rate Route Frequency Ordered Stop   12/15/19 1800  enoxaparin (LOVENOX) injection 30 mg        30 mg Subcutaneous Every 12 hours 12/15/19 0804         Plan: D/C from anesthesia care at surgeon's request  Evolette Pendell

## 2019-12-15 NOTE — Lactation Note (Signed)
This note was copied from a baby's chart. Lactation Consultation Note  Patient Name: Kelly Baxter PIRJJ'O Date: 12/15/2019   Mom's feeding choice on admission was to bottle feed formula.  Discussed option with mom again and she still chooses to give bottles of formula.  Hand out given on Infant Formula Preparation and reviewed preparation with water, cleaning and sanitizing equipment and bottles, storage, how to prevent cronobacter and warming and giving formula.  Explained how to dry up her breast milk by wearing well fitting supportive bra, cold, cabbage leaves, no warmth, stimulation or extraction of milk.  Discussed differences, prevention and treatment of full breasts, engorgement and mastitis and when to seek further help from physician.  Name and number written on white board and encouraged to call with any questions, concerns or assistance.  Maternal Data    Feeding    LATCH Score                   Interventions    Lactation Tools Discussed/Used     Consult Status      Louis Meckel 12/15/2019, 4:40 PM

## 2019-12-16 ENCOUNTER — Other Ambulatory Visit: Payer: Self-pay | Admitting: Advanced Practice Midwife

## 2019-12-16 DIAGNOSIS — O169 Unspecified maternal hypertension, unspecified trimester: Secondary | ICD-10-CM

## 2019-12-16 MED ORDER — ENOXAPARIN SODIUM 30 MG/0.3ML ~~LOC~~ SOLN: 30.0000 mg | Freq: Two times a day (BID) | SUBCUTANEOUS | 1 refills | Status: DC

## 2019-12-16 MED ORDER — OXYCODONE HCL 5 MG PO TABS: 5.0000 mg | ORAL_TABLET | Freq: Four times a day (QID) | ORAL | 0 refills | Status: AC | PRN

## 2019-12-16 MED ORDER — ENOXAPARIN SODIUM 30 MG/0.3ML ~~LOC~~ SOLN: 40.0000 mg | SUBCUTANEOUS | 0 refills | Status: DC

## 2019-12-16 NOTE — Progress Notes (Signed)
Reviewed D/C instructions with pt and family. Pt verbalized understanding of teaching. Discharged to home via W/C. Pt to schedule f/u appt.  

## 2019-12-16 NOTE — Progress Notes (Signed)
Order placed for lab draw- serum creatinine. Left voicemail and my chart message sent for patient to call office for lab-only visit tomorrow.

## 2019-12-17 ENCOUNTER — Encounter: Payer: Self-pay | Admitting: Obstetrics and Gynecology

## 2019-12-17 ENCOUNTER — Other Ambulatory Visit: Payer: Medicaid Other

## 2019-12-17 ENCOUNTER — Other Ambulatory Visit: Payer: Self-pay

## 2019-12-17 ENCOUNTER — Ambulatory Visit: Payer: Medicaid Other

## 2019-12-17 ENCOUNTER — Encounter: Payer: Medicaid Other | Admitting: Obstetrics & Gynecology

## 2019-12-17 DIAGNOSIS — O99210 Obesity complicating pregnancy, unspecified trimester: Secondary | ICD-10-CM

## 2019-12-17 DIAGNOSIS — O403XX0 Polyhydramnios, third trimester, not applicable or unspecified: Secondary | ICD-10-CM

## 2019-12-17 DIAGNOSIS — O169 Unspecified maternal hypertension, unspecified trimester: Secondary | ICD-10-CM

## 2019-12-18 LAB — CREATININE, SERUM
Creatinine, Ser: 0.87 mg/dL (ref 0.57–1.00)
GFR calc Af Amer: 104 mL/min/{1.73_m2} (ref >59)
GFR calc non Af Amer: 90 mL/min/{1.73_m2} (ref >59)

## 2019-12-19 ENCOUNTER — Other Ambulatory Visit: Payer: Self-pay | Admitting: Obstetrics and Gynecology

## 2019-12-19 ENCOUNTER — Telehealth: Payer: Self-pay

## 2019-12-19 DIAGNOSIS — O165 Unspecified maternal hypertension, complicating the puerperium: Secondary | ICD-10-CM

## 2019-12-19 MED ORDER — NIFEDIPINE ER OSMOTIC RELEASE 60 MG PO TB24: 60.0000 mg | ORAL_TABLET | Freq: Every day | ORAL | 1 refills | Status: DC

## 2019-12-19 NOTE — Telephone Encounter (Signed)
Pt calling about BP and On-Q pain relief system.  [phone]  Pt states her current BP is 163-106; before it was 140 something/98.  Also, she removed the ON-Q pump yesterday and the bandaid stays soaked c clear liquid which I adv her to place a sanitary pad there and stick it to her underwear to get her to her appt tomorrow c PH.  Will get in touch c SDJ about BP.

## 2019-12-19 NOTE — Telephone Encounter (Signed)
BP running 160/106.   Usually running 140/90s.  Not taking anything for BPs  She denies headaches, visual changes, and RUQ pain.   Instructed to re-check BPs in 30 minutes.   I will call in nifedipine XL 60 mg to be started today.  If she develops headaches, visual changes, and RUQ pain, she is to call the after hours line or go to the ER.

## 2019-12-20 ENCOUNTER — Other Ambulatory Visit: Payer: Self-pay

## 2019-12-20 ENCOUNTER — Inpatient Hospital Stay
Admission: EM | Admit: 2019-12-20 | Discharge: 2019-12-22 | DRG: 776 | Disposition: A | Discharge status: Home / Self Care | Payer: Medicaid Other | Attending: Obstetrics & Gynecology | Admitting: Obstetrics & Gynecology

## 2019-12-20 ENCOUNTER — Ambulatory Visit: Payer: Medicaid Other | Admitting: Obstetrics & Gynecology

## 2019-12-20 ENCOUNTER — Encounter: Payer: Self-pay | Admitting: Obstetrics and Gynecology

## 2019-12-20 DIAGNOSIS — O1415 Severe pre-eclampsia, complicating the puerperium: Principal | ICD-10-CM

## 2019-12-20 DIAGNOSIS — Z79899 Other long term (current) drug therapy: Secondary | ICD-10-CM | POA: Diagnosis not present

## 2019-12-20 DIAGNOSIS — O9081 Anemia of the puerperium: Secondary | ICD-10-CM | POA: Diagnosis present

## 2019-12-20 DIAGNOSIS — Z20822 Contact with and (suspected) exposure to covid-19: Secondary | ICD-10-CM | POA: Diagnosis present

## 2019-12-20 DIAGNOSIS — O1495 Unspecified pre-eclampsia, complicating the puerperium: Secondary | ICD-10-CM | POA: Diagnosis present

## 2019-12-20 DIAGNOSIS — R030 Elevated blood-pressure reading, without diagnosis of hypertension: Secondary | ICD-10-CM | POA: Diagnosis present

## 2019-12-20 DIAGNOSIS — D62 Acute posthemorrhagic anemia: Secondary | ICD-10-CM | POA: Diagnosis present

## 2019-12-20 LAB — COMPREHENSIVE METABOLIC PANEL
ALT: 37 U/L (ref 0–44)
AST: 32 U/L (ref 15–41)
Albumin: 2.7 g/dL — ABNORMAL LOW (ref 3.5–5.0)
Alkaline Phosphatase: 111 U/L (ref 38–126)
Anion gap: 8 (ref 5–15)
BUN: 9 mg/dL (ref 6–20)
CO2: 27 mmol/L (ref 22–32)
Calcium: 8.7 mg/dL — ABNORMAL LOW (ref 8.9–10.3)
Chloride: 105 mmol/L (ref 98–111)
Creatinine, Ser: 0.82 mg/dL (ref 0.44–1.00)
GFR, Estimated: >60 mL/min (ref >60)
Glucose, Bld: 89 mg/dL (ref 70–99)
Potassium: 4 mmol/L (ref 3.5–5.1)
Sodium: 140 mmol/L (ref 135–145)
Total Bilirubin: 0.5 mg/dL (ref 0.3–1.2)
Total Protein: 6.3 g/dL — ABNORMAL LOW (ref 6.5–8.1)

## 2019-12-20 LAB — CBC WITH DIFFERENTIAL/PLATELET
Abs Immature Granulocytes: 0.09 10*3/uL — ABNORMAL HIGH (ref 0.00–0.07)
Basophils Absolute: 0 10*3/uL (ref 0.0–0.1)
Basophils Relative: 0 %
Eosinophils Absolute: 0.2 10*3/uL (ref 0.0–0.5)
Eosinophils Relative: 2 %
HCT: 32.2 % — ABNORMAL LOW (ref 36.0–46.0)
Hemoglobin: 10.2 g/dL — ABNORMAL LOW (ref 12.0–15.0)
Immature Granulocytes: 1 %
Lymphocytes Relative: 15 %
Lymphs Abs: 1.4 10*3/uL (ref 0.7–4.0)
MCH: 27.3 pg (ref 26.0–34.0)
MCHC: 31.7 g/dL (ref 30.0–36.0)
MCV: 86.3 fL (ref 80.0–100.0)
Monocytes Absolute: 0.7 10*3/uL (ref 0.1–1.0)
Monocytes Relative: 7 %
Neutro Abs: 6.9 10*3/uL (ref 1.7–7.7)
Neutrophils Relative %: 75 %
Platelets: 261 10*3/uL (ref 150–400)
RBC: 3.73 MIL/uL — ABNORMAL LOW (ref 3.87–5.11)
RDW: 16.4 % — ABNORMAL HIGH (ref 11.5–15.5)
WBC: 9.4 10*3/uL (ref 4.0–10.5)
nRBC: 0.2 % (ref 0.0–0.2)

## 2019-12-20 LAB — PROTEIN / CREATININE RATIO, URINE
Creatinine, Urine: 155 mg/dL
Protein Creatinine Ratio: 2.93 mg/mg{Cre} — ABNORMAL HIGH (ref 0.00–0.15)
Total Protein, Urine: 454 mg/dL

## 2019-12-20 LAB — RESPIRATORY PANEL BY RT PCR (FLU A&B, COVID)
Influenza A by PCR: NEGATIVE
Influenza B by PCR: NEGATIVE
SARS Coronavirus 2 by RT PCR: NEGATIVE

## 2019-12-20 MED ORDER — NIFEDIPINE 10 MG PO CAPS: 20.0000 mg | ORAL_CAPSULE | ORAL | Status: DC | PRN

## 2019-12-20 MED ORDER — HYDRALAZINE HCL 20 MG/ML IJ SOLN: 10.0000 mg | INTRAMUSCULAR | Status: DC | PRN

## 2019-12-20 MED ORDER — KCL IN DEXTROSE-NACL 20-5-0.45 MEQ/L-%-% IV SOLN
INTRAVENOUS | Status: DC
  Filled 2019-12-20 (×8): qty 1000

## 2019-12-20 MED ORDER — MAGNESIUM SULFATE BOLUS VIA INFUSION
4.0000 g | Freq: Once | INTRAVENOUS | Status: AC
  Administered 2019-12-20: 4 g via INTRAVENOUS
  Filled 2019-12-20: qty 1000

## 2019-12-20 MED ORDER — MAGNESIUM SULFATE 40 GM/1000ML IV SOLN
2.0000 g/h | INTRAVENOUS | Status: DC
  Administered 2019-12-20 – 2019-12-21 (×2): 2 g/h via INTRAVENOUS
  Filled 2019-12-20 (×2): qty 1000

## 2019-12-20 MED ORDER — LABETALOL HCL 5 MG/ML IV SOLN: 40.0000 mg | INTRAVENOUS | Status: DC | PRN

## 2019-12-20 MED ORDER — ACETAMINOPHEN 500 MG PO TABS
ORAL_TABLET | ORAL | Status: AC
  Filled 2019-12-20: qty 2

## 2019-12-20 MED ORDER — NIFEDIPINE 10 MG PO CAPS: 10.0000 mg | ORAL_CAPSULE | ORAL | Status: DC | PRN

## 2019-12-20 MED ORDER — ACETAMINOPHEN 500 MG PO TABS
1000.0000 mg | ORAL_TABLET | Freq: Four times a day (QID) | ORAL | Status: DC | PRN
  Administered 2019-12-20 – 2019-12-21 (×2): 1000 mg via ORAL
  Filled 2019-12-20: qty 2

## 2019-12-20 MED ORDER — LABETALOL HCL 5 MG/ML IV SOLN: 80.0000 mg | INTRAVENOUS | Status: DC | PRN

## 2019-12-20 MED ORDER — LABETALOL HCL 5 MG/ML IV SOLN: 20.0000 mg | INTRAVENOUS | Status: DC | PRN

## 2019-12-20 MED ORDER — NIFEDIPINE ER OSMOTIC RELEASE 30 MG PO TB24
90.0000 mg | ORAL_TABLET | Freq: Every day | ORAL | Status: DC
  Administered 2019-12-20 – 2019-12-21 (×2): 90 mg via ORAL
  Filled 2019-12-20 (×2): qty 3

## 2019-12-20 MED ORDER — ENOXAPARIN SODIUM 30 MG/0.3ML ~~LOC~~ SOLN
30.0000 mg | Freq: Two times a day (BID) | SUBCUTANEOUS | Status: DC
  Administered 2019-12-20 – 2019-12-22 (×4): 30 mg via SUBCUTANEOUS
  Filled 2019-12-20 (×6): qty 0.3

## 2019-12-20 NOTE — H&P (Signed)
OBSTETRICS AND GYNECOLOGY ADMISSION HISTORY AND PHYSICAL NOTE    KENESHA MOSHIER [id number] 12/20/2019 4:53 PM    Chief Complaint:   Kelly Baxter is a 29 y.o. G88P1001 female who is POD#6 from a primary low transverse cesarean section for fetal intolerance of labor.  She presents with new-onset severely elevated blood pressure and intractable headache.     History of Present Ilness:   She began noticing elevated blood pressure at home yesterday (160s/100s).  She called the office yesterday with the above blood pressure and no symptoms. She had been running in the 140s/90s range.  Nifedipine XL 60 mg was called in for her yesterday evening, which she took and she was given instructions regarding circumstances where she should let us know (continued severe range blood pressures, headache, visual changes, RUQ/epigastric pain, etc.).  SHe had a headache earlier in the day. She tried Tylenol and ibuprofen, which did not resolve her headache.  She denies visual change and RUQ/epigastric pain.  She has not taken her dose of nifedipine for today.   She notes normal lochia. She denies fevers, chills, nausea, vomiting. She is ambulating, voiding, and stooling normally.  She notes mild abdominal discomfort and her incision pain seems well-controlled.   Past Medical History:  Diagnosis Date   Adult BMI 40.0-44.9 kg/sq m (HCC)    Anxiety    GERD (gastroesophageal reflux disease)    Herpes    Obesity affecting pregnancy    Past Surgical History:  Procedure Laterality Date   CESAREAN SECTION  12/14/2019   Procedure: CESAREAN SECTION;  Surgeon: Conard Novak, MD;  Location: [facility] ORS;  Service: Obstetrics;;   NO PAST SURGERIES     Allergies  Allergen Reactions   Aluminum Hives and Rash   Aluminum-Containing Compounds Hives   Other     Trees, weeds, grass, dust mites.   Prior to Admission medications   Medication Sig Start Date End Date Taking? Authorizing Provider  acyclovir  (ZOVIRAX) 400 MG tablet Take 400 mg by mouth as needed.  01/22/19   [provider]  docusate sodium (COLACE) 100 MG capsule Take 100 mg by mouth daily as needed for mild constipation.    [provider]  enoxaparin (LOVENOX) 30 MG/0.3ML injection Inject 0.3 mLs (30 mg total) into the skin every 12 (twelve) hours. 12/16/19 01/27/20  Tresea Mall, CNM  fluticasone (FLONASE) 50 MCG/ACT nasal spray Place 2 sprays into both nostrils daily. As needed for nasal congestion 01/08/15   Jolene Provost, MD  NIFEdipine (PROCARDIA XL) 60 MG 24 hr tablet Take 1 tablet (60 mg total) by mouth daily. 12/19/19   Conard Novak, MD  oxyCODONE (OXY IR/ROXICODONE) 5 MG immediate release tablet Take 1 tablet (5 mg total) by mouth every 6 (six) hours as needed for up to 5 days for severe pain. 12/16/19 12/21/19  Tresea Mall, CNM  Prenatal Vit-Fe Fumarate-FA (MULTIVITAMIN-PRENATAL) 27-0.8 MG TABS tablet Take 1 tablet by mouth daily at 12 noon.    [provider]  cetirizine (ZYRTEC) 10 MG tablet Take 10 mg by mouth daily.  06/04/19  [provider]   Social History:  She  reports that she has never smoked. She has never used smokeless tobacco. She reports previous alcohol use. She reports that she does not use drugs.  Family History:  family history includes Diabetes in her maternal aunt, maternal aunt, maternal grandfather, and maternal grandmother; Heart Problems in her mother; Hypertension in her maternal grandfather; Thyroid disease in her  mother.   Review of Systems:   Review of Systems  Constitutional: Negative.   HENT: Negative.   Eyes: Negative.  Negative for blurred vision and double vision.  Respiratory: Negative.   Cardiovascular: Negative.   Gastrointestinal: Negative.   Genitourinary: Negative.   Musculoskeletal: Negative.   Skin: Negative.   Neurological: Positive for headaches. Negative for dizziness, tingling, tremors, sensory change, speech change, focal  weakness, seizures, loss of consciousness and weakness.  Psychiatric/Behavioral: Negative.      Objective    BP (!) 147/91 (BP Location: Left Arm)    Pulse 79    SpO2 97%  Physical Exam  Physical Exam Constitutional:      General: She is not in acute distress.    Appearance: Normal appearance. She is well-developed.  HENT:     Head: Normocephalic and atraumatic.  Eyes:     General: No scleral icterus.    Conjunctiva/sclera: Conjunctivae normal.  Cardiovascular:     Rate and Rhythm: Normal rate and regular rhythm.     Heart sounds: No murmur heard.  No friction rub. No gallop.   Pulmonary:     Effort: Pulmonary effort is normal. No respiratory distress.     Breath sounds: Normal breath sounds. No wheezing or rales.  Abdominal:     General: Bowel sounds are normal. There is no distension.     Palpations: Abdomen is soft. There is no mass.     Tenderness: There is no abdominal tenderness. There is no guarding or rebound.     Comments: Incision: Prevena would vac in place.  Appears dry.   Musculoskeletal:        General: Normal range of motion.     Cervical back: Normal range of motion and neck supple.     Right lower leg: Edema (1+) present.     Left lower leg: Edema (1+) present.  Neurological:     General: No focal deficit present.     Mental Status: She is alert and oriented to person, place, and time.     Cranial Nerves: No cranial nerve deficit.  Skin:    General: Skin is warm and dry.     Findings: No erythema.  Psychiatric:        Mood and Affect: Mood normal.        Behavior: Behavior normal.        Judgment: Judgment normal.      Laboratory Results:   Lab Results  Component Value Date   WBC 9.4 12/20/2019   HGB 10.2 (L) 12/20/2019   HCT 32.2 (L) 12/20/2019   PLT 261 12/20/2019   CREATININE 0.82 12/20/2019   ALT 37 12/20/2019   AST 32 12/20/2019   PROTCRRATIO 2.93 (H) 12/20/2019     Assessment & Plan   CARMELINA BALDUCCI is a 29 y.o. G18P1001 female  who is POD#6 from a primary cesarean section who was originally delivered due to preeclampsia without severe features. She is admitted for preeclampsia with severe features in the post-partum period.    Plan:  1.  Preeclampsia with severe features: magnesium per protocol.  Treat severe range blood pressures, as indicated.  Continue and increase nifedipine XL to 90 mg daily.   2.  Diet: regular 3. Prophylaxis: SCDs, enoxaparin 30 mg bid .   4. Pain control: as needed.  5. Discussed above plan with patient and she agrees with plan. All questions answered.   Thomasene Mohair, MD 12/20/2019 4:53 PM

## 2019-12-20 NOTE — Progress Notes (Signed)
RN at bedside to re-evaluate patient blood pressure. RN noticed that patient's eyes seem to be a little bit puffy compared to initial assessment. RN to notify provider. Will continue to monitor.

## 2019-12-20 NOTE — Progress Notes (Signed)
Pt. Presented to L&D for [facility] evaluation. After provider reviewed blood pressure trends, decided to admit pt. And start 24 hour magnesium infusion. Magnesium infusion started. Intubation kit and calcium gluconate in room. Blood pressures set to cycle every 30 minutes. Will continue to evaluate per magnesium assessment protocols.

## 2019-12-21 DIAGNOSIS — O1415 Severe pre-eclampsia, complicating the puerperium: Secondary | ICD-10-CM

## 2019-12-21 DIAGNOSIS — Z79899 Other long term (current) drug therapy: Secondary | ICD-10-CM

## 2019-12-21 MED ORDER — NIFEDIPINE ER OSMOTIC RELEASE 90 MG PO TB24
90.0000 mg | ORAL_TABLET | Freq: Every day | ORAL | 1 refills | Status: DC
Start: 2019-12-21 — End: 2019-12-22

## 2019-12-21 MED ORDER — BUTALBITAL-APAP-CAFFEINE 50-325-40 MG PO TABS
2.0000 | ORAL_TABLET | Freq: Four times a day (QID) | ORAL | Status: DC | PRN
  Administered 2019-12-21: 2 via ORAL
  Filled 2019-12-21: qty 2

## 2019-12-21 NOTE — Progress Notes (Signed)
Obstetric Postpartum/PostOperative Daily Progress Note Subjective:  29 y.o. G1P1001 post-operative day # 7 status post primary cesarean delivery.  She is ambulating, is tolerating po, is voiding spontaneously.  Her pain is well controlled on PO pain medications. Her lochia is less than menses.  She denies symptoms of trouble breathing, trouble ambulating. She has a mild headache. She had a headache last night that was relieved by Tylenol.  Her headache is mild now compared to when she came in yesterday.    Medications SCHEDULED MEDICATIONS  . enoxaparin (LOVENOX) injection  30 mg Subcutaneous Q12H  . NIFEdipine  90 mg Oral Daily    MEDICATION INFUSIONS  . dextrose 5 % and 0.45 % NaCl with KCl 20 mEq/L 75 mL/hr at 12/21/19 0600  . magnesium sulfate 2 g/hr (12/21/19 0600)    PRN MEDICATIONS  acetaminophen, labetalol **AND** labetalol **AND** labetalol **AND** hydrALAZINE **AND** Measure blood pressure    Objective:   Vitals:   12/21/19 0405 12/21/19 0500 12/21/19 0600 12/21/19 0606  BP: (!) 144/90   (!) 142/79  Pulse: (!) 102   94  Resp:      Temp:  98 F (36.7 C)    TempSrc:  Oral    SpO2: 93%  94%   Weight:      Height:        Current Vital Signs 24h Vital Sign Ranges  T 98 F (36.7 C) Temp  Avg: 98.3 F (36.8 C)  Min: 98 F (36.7 C)  Max: 98.7 F (37.1 C)  BP (!) 142/79 BP  Min: 135/85  Max: 160/92  HR 94 Pulse  Avg: 91  Min: 71  Max: 123  RR 16 Resp  Avg: 16.7  Min: 16  Max: 18  SaO2 94 %   SpO2  Avg: 96 %  Min: 90 %  Max: 100 %       24 Hour I/O Current Shift I/O  Time Ins Outs 10/28 0701 - 10/29 0700 In: 1583.3 [I.V.:1583.3] Out: 3300 [Urine:3300] No intake/output data recorded.  General: NAD Pulmonary: no increased work of breathing Abdomen: non-distended, non-tender, fundus firm at level of umbilicus Inc: Clean/dry/intact, Prevena removed and 1/2" steri strips with benzoid applied.  It is without erythema, induration, warmth, and tenderness.   Extremities: no  edema, no erythema, no tenderness DTRs: 2+  Labs:  Recent Labs  Lab 12/15/19 0837 12/20/19 1600  WBC 14.7* 9.4  HGB 10.2* 10.2*  HCT 32.2* 32.2*  PLT 153 261    Assessment:   29 y.o. G1P1001 postoperative day # 7 status post primary cesarean section, admitted for preeclampsia with severe features, now on magnesium without signs/symptoms of magnesium toxicity.  Plan:  1) Acute blood loss anemia - hemodynamically stable and asymptomatic - po ferrous sulfate  2) O POS / Rubella 6.08 (03/26 1057)/ Varicella Immune  3) Preeclampsia with severe features: continue mag until 24 hours. Continue nifedipine at current dose. Titrate/add meds, as indicated.    4) Disposition: possibly home tomorrow.  Conard Novak, MD, FACOG 12/21/2019 7:51 AM

## 2019-12-21 NOTE — Progress Notes (Signed)
Discussed w pt care and plan of management She continues to have headache, currently rates it 7/10 No s/sx magnesium toxicity Most BP WNL today Good UOP  Plan- stop mag at 24 hours Monitor pt overnight.  Serial BPs. Cont Procardia 90 mg daily for now and on discharge.  Annamarie Major, MD, [facility], [facility] 12/21/2019  4:36 PM

## 2019-12-22 ENCOUNTER — Other Ambulatory Visit: Payer: Self-pay | Admitting: Obstetrics

## 2019-12-22 DIAGNOSIS — O1495 Unspecified pre-eclampsia, complicating the puerperium: Secondary | ICD-10-CM

## 2019-12-22 MED ORDER — NIFEDIPINE ER OSMOTIC RELEASE 90 MG PO TB24: 90.0000 mg | ORAL_TABLET | Freq: Every day | ORAL | 1 refills | Status: DC

## 2019-12-22 NOTE — Discharge Summary (Signed)
Postpartum Discharge Summary  Date of Service updated10/29/2021     Patient Name: Kelly Baxter DOB: 09-Nov-1990 MRN: [id number]  Date of admission: 12/20/2019 Date of discharge: 12/22/2019  Admitting diagnosis: Preeclampsia in postpartum period [O14.95] Intrauterine pregnancy: Unknown     Secondary diagnosis:  Active Problems:   Preeclampsia in postpartum period  Additional problems: none    Discharge diagnosis: Preeclampsia (severe)                                              Post partum procedures:magnesium sulphate   Hospital course:  Patient was admitted to Labor and Delivery on 10/28 21 at 6 days post CS for elevated blood pressures and perisitant headache. Diagnosis of pre eclampsia, she was treated for 24 hours with magnesium sulphate.hypertension was brought into better range with an adjustment in her Procardia dosage from 60 to 90 mg XL.  She is discharged home on 10/30 2021, with plans for follow up in 5 days at [place] for a blood pressure check and incision check.  Magnesium Sulfate received: Yes: Seizure prophylaxis BMZ received: No Rhophylac:N/A MMR:N/A T-DaP:Given prenatally Flu: N/A Transfusion:No  Physical exam  Vitals:   12/21/19 1954 12/21/19 2324 12/22/19 0340 12/22/19 0805  BP: (!) 146/93 139/86 131/68 (!) 150/77  Pulse: 93 79 94 85  Resp:      Temp: 98.3 F (36.8 C) 97.9 F (36.6 C) 97.9 F (36.6 C) 97.8 F (36.6 C)  TempSrc: Oral Oral Oral Oral  SpO2: 100% 95% 96% 99%  Weight:      Height:       General: alert, cooperative and no distress Lochia: appropriate Uterine Fundus: firm Incision: Healing well with no significant drainage, No significant erythema, steri strips in place. DVT Evaluation: No evidence of DVT seen on physical exam. Negative Homan's sign. No cords or calf tenderness. Labs: Lab Results  Component Value Date   WBC 9.4 12/20/2019   HGB 10.2 (L) 12/20/2019   HCT 32.2 (L) 12/20/2019   MCV 86.3 12/20/2019    PLT 261 12/20/2019   CMP Latest Ref Rng & Units 12/20/2019  Glucose 70 - 99 mg/dL 89  BUN 6 - 20 mg/dL 9  Creatinine 0.44 - 1.00 mg/dL 0.82  Sodium 135 - 145 mmol/L 140  Potassium 3.5 - 5.1 mmol/L 4.0  Chloride 98 - 111 mmol/L 105  CO2 22 - 32 mmol/L 27  Calcium 8.9 - 10.3 mg/dL 8.7(L)  Total Protein 6.5 - 8.1 g/dL 6.3(L)  Total Bilirubin 0.3 - 1.2 mg/dL 0.5  Alkaline Phos 38 - 126 U/L 111  AST 15 - 41 U/L 32  ALT 0 - 44 U/L 37   Edinburgh Score: Edinburgh Postnatal Depression Scale Screening Tool 12/15/2019  I have been able to laugh and see the funny side of things. 0  I have looked forward with enjoyment to things. 0  I have blamed myself unnecessarily when things went wrong. 1  I have been anxious or worried for no good reason. 2  I have felt scared or panicky for no good reason. 0  Things have been getting on top of me. 0  I have been so unhappy that I have had difficulty sleeping. 0  I have felt sad or miserable. 0  I have been so unhappy that I have been crying. 0  The thought of  harming myself has occurred to me. 0  Edinburgh Postnatal Depression Scale Total 3      After visit meds:  Allergies as of 12/22/2019      Reactions   Aluminum Hives, Rash   Aluminum-containing Compounds Hives   Other    Trees, weeds, grass, dust mites.      Medication List    TAKE these medications   acyclovir 400 MG tablet Commonly known as: ZOVIRAX Take 400 mg by mouth as needed.   docusate sodium 100 MG capsule Commonly known as: COLACE Take 100 mg by mouth daily as needed for mild constipation.   enoxaparin 30 MG/0.3ML injection Commonly known as: LOVENOX Inject 0.3 mLs (30 mg total) into the skin every 12 (twelve) hours.   fluticasone 50 MCG/ACT nasal spray Commonly known as: FLONASE Place 2 sprays into both nostrils daily. As needed for nasal congestion   multivitamin-prenatal 27-0.8 MG Tabs tablet Take 1 tablet by mouth daily at 12 noon.   NIFEdipine 90 MG 24  hr tablet Commonly known as: PROCARDIA XL/NIFEDICAL-XL Take 1 tablet (90 mg total) by mouth daily. What changed:   medication strength  how much to take     ASK your doctor about these medications   oxyCODONE 5 MG immediate release tablet Commonly known as: Oxy IR/ROXICODONE Take 1 tablet (5 mg total) by mouth every 6 (six) hours as needed for up to 5 days for severe pain. Ask about: Should I take this medication?        Discharge home in stable condition . Activity: Advance as tolerated. Pelvic rest for 6 weeks.  Diet: low salt diet Anticipated Birth Control: OCPs Postpartum Appointment:1 week Additional Postpartum F/U: NA Future Appointments: Future Appointments  Date Time Provider [place]  01/22/2020  2:30 PM Will Bonnet, MD [facility] None   Follow up Visit:  Follow-up Information    Will Bonnet, MD Follow up.   Specialty: Obstetrics and Gynecology Why: Please call the [facility] office and ask for a n appointment wtih Dr. Glennon Mac or any MD for a blood pressure check and follow up appointment. Contact information: [address] [place] [address] [phone]                   12/22/2019 Imagene Riches, CNM

## 2019-12-22 NOTE — Progress Notes (Signed)
Reviewed D/C instructions with pt and family. Pt verbalized understanding of teaching. Discharged to home via W/C. Pt to schedule f/u appt.  

## 2019-12-25 ENCOUNTER — Ambulatory Visit (INDEPENDENT_AMBULATORY_CARE_PROVIDER_SITE_OTHER): Payer: Medicaid Other | Admitting: Obstetrics & Gynecology

## 2019-12-25 ENCOUNTER — Encounter: Payer: Self-pay | Admitting: Obstetrics & Gynecology

## 2019-12-25 ENCOUNTER — Other Ambulatory Visit: Payer: Self-pay

## 2019-12-25 VITALS — BP 120/80 | Ht 65.0 in | Wt 266.0 lb

## 2019-12-25 DIAGNOSIS — O1495 Unspecified pre-eclampsia, complicating the puerperium: Secondary | ICD-10-CM | POA: Diagnosis not present

## 2019-12-25 DIAGNOSIS — O165 Unspecified maternal hypertension, complicating the puerperium: Secondary | ICD-10-CM

## 2019-12-25 NOTE — Progress Notes (Signed)
Obstetrics & Gynecology Office Visit   Chief Complaint  Patient presents with  . Blood Pressure Check    History of Present Illness: 29 y.o. G1P1001 being seen for follow up blood pressure check today.  The patient is postpartum 1-2 weeks.  She has been re-admotted for preeclampsia w Magnesium terapy, and subsequent d/c on Procardia 90 mg daily. The established diagnosis for the patient is preeclampsia with severe features.  She is currently on nifedipine ER 29mg .  She reports no current symptoms attributable to her blood pressure.  Medication list reviewed medications which may contribute to BP elevation were not noted.  Past Medical History:  Past Medical History:  Diagnosis Date  . Adult BMI 40.0-44.9 kg/sq m (HCC)   . Anxiety   . GERD (gastroesophageal reflux disease)   . Herpes   . Obesity affecting pregnancy     Past Surgical History:  Past Surgical History:  Procedure Laterality Date  . CESAREAN SECTION  12/14/2019   Procedure: CESAREAN SECTION;  Surgeon: 12/16/2019, MD;  Location: [facility] ORS;  Service: Obstetrics;;  . NO PAST SURGERIES      Gynecologic History: No LMP recorded.  Obstetric History: G1P1001  Family History:  Family History  Problem Relation Age of Onset  . Heart Problems Mother   . Thyroid disease Mother   . Diabetes Maternal Grandmother   . Diabetes Maternal Grandfather   . Hypertension Maternal Grandfather   . Diabetes Maternal Aunt   . Diabetes Maternal Aunt     Social History:  Social History   Socioeconomic History  . Marital status: Significant Other    Spouse name: Derrick (FOB)  . Number of children: 0  . Years of education: Not on file  . Highest education level: Not on file  Occupational History  . 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 Smoking status: Never Smoker  . Smokeless tobacco: Never Used  Vaping Use  . Vaping Use: Never used  Substance and Sexual Activity  . Alcohol use:  Not Currently  . Drug use: No  . Sexual activity: Yes    Birth control/protection: None    Comment: undecided  Other Topics Concern  . Not on file  Social History Narrative  . Not on file   Social Determinants of Health   Financial Resource Strain:   . Difficulty of Paying Living Expenses: Not on file  Food Insecurity:   . Worried About Diplomatic Services operational officer in the Last Year: Not on file  . Ran Out of Food in the Last Year: Not on file  Transportation Needs:   . Lack of Transportation (Medical): Not on file  . Lack of Transportation (Non-Medical): Not on file  Physical Activity:   . Days of Exercise per Week: Not on file  . Minutes of Exercise per Session: Not on file  Stress:   . Feeling of Stress : Not on file  Social Connections:   . Frequency of Communication with Friends and Family: Not on file  . Frequency of Social Gatherings with Friends and Family: Not on file  . Attends Religious Services: Not on file  . Active Member of Clubs or Organizations: Not on file  . Attends Programme researcher, broadcasting/film/video Meetings: Not on file  . Marital Status: Not on file  Intimate Partner Violence:   . Fear of Current or Ex-Partner: Not on file  . Emotionally Abused: Not on file  . Physically Abused:  Not on file  . Sexually Abused: Not on file    Allergies:  Allergies  Allergen Reactions  . Aluminum Hives and Rash  . Aluminum-Containing Compounds Hives  . Other     Trees, weeds, grass, dust mites.    Medications: Prior to Admission medications   Medication Sig Start Date End Date Taking? Authorizing Provider  acyclovir (ZOVIRAX) 400 MG tablet Take 400 mg by mouth as needed.  01/22/19  Yes [provider]  docusate sodium (COLACE) 100 MG capsule Take 100 mg by mouth daily as needed for mild constipation.    Yes [provider]  enoxaparin (LOVENOX) 30 MG/0.3ML injection Inject 0.3 mLs (30 mg total) into the skin every 12 (twelve) hours. 12/16/19 01/27/20 Yes Tresea Mall, CNM  fluticasone (FLONASE) 50 MCG/ACT nasal spray Place 2 sprays into both nostrils daily. As needed for nasal congestion 01/08/15  Yes Jolene Provost, MD  NIFEdipine (PROCARDIA XL/NIFEDICAL-XL) 90 MG 24 hr tablet Take 1 tablet (90 mg total) by mouth daily. 12/22/19  Yes Mirna Mires, CNM  Prenatal Vit-Fe Fumarate-FA (MULTIVITAMIN-PRENATAL) 27-0.8 MG TABS tablet Take 1 tablet by mouth daily at 12 noon.    Yes [provider]  cetirizine (ZYRTEC) 10 MG tablet Take 10 mg by mouth daily.  06/04/19  [provider]    ROS  Physical Exam Blood pressure 120/80, height 5\' 5"  (1.651 m), weight 266 lb (120.7 kg), unknown if currently breastfeeding.  No LMP recorded.  General: NAD HEENT: normocephalic, anicteric Pulmonary: No increased work of breathing Cardiovascular: RRR, distal pulses 2+ Extremities: 1+edema, no erythema, no tenderness Neurologic: Grossly intact Psychiatric: mood appropriate, affect full  Assessment: 29 y.o. G1P1001 presenting for Hypertension Evaluation after admission to hospital for preeclampsia  Plan: Problem List Items Addressed This Visit      Cardiovascular and Mediastinum   Preeclampsia in postpartum period - Primary    Other Visit Diagnoses    Hypertension, postpartum condition or complication          1) Blood pressure - blood pressure at today's visit is normotensive.  As a result no current adjustments were made to the patient's antihypertensive therapy. - additional blood work was not obtained - Cont Procardia 90 mg for now, plan taper or cessation at 6 weeks or thereafter  A total of 20 minutes were spent face-to-face with the patient as well as preparation, review, communication, and documentation during this encounter.   37, MD, Annam[facility] Major Ob/G[facility], [facility] 12/25/2019  10:59 AM

## 2020-01-08 ENCOUNTER — Telehealth: Payer: Self-pay

## 2020-01-09 ENCOUNTER — Other Ambulatory Visit: Payer: Self-pay

## 2020-01-09 ENCOUNTER — Ambulatory Visit (INDEPENDENT_AMBULATORY_CARE_PROVIDER_SITE_OTHER): Payer: Medicaid Other | Admitting: Obstetrics and Gynecology

## 2020-01-09 ENCOUNTER — Encounter: Payer: Self-pay | Admitting: Obstetrics and Gynecology

## 2020-01-09 VITALS — BP 148/84 | Ht 65.0 in | Wt 263.0 lb

## 2020-01-09 DIAGNOSIS — O1495 Unspecified pre-eclampsia, complicating the puerperium: Secondary | ICD-10-CM

## 2020-01-09 DIAGNOSIS — Z09 Encounter for follow-up examination after completed treatment for conditions other than malignant neoplasm: Secondary | ICD-10-CM

## 2020-01-09 NOTE — Progress Notes (Signed)
   Postoperative Follow-up Patient presents post op from cesarean section  3 weeks ago.  Subjective: She denies fever, chills, nausea and vomiting. Eating a regular diet without difficulty. The patient is not having any pain.  Activity: increasing slowly. She notes some left sided pain with coughing and sneezing at the incision. Otherwise, she denies issues with her incision.    Objective: BP (!) 148/84   Ht 5\' 5"  (1.651 m)   Wt 263 lb (119.3 kg)   BMI 43.77 kg/m   Constitutional: Well nourished, well developed female in no acute distress.  HEENT: normal Skin: Warm and dry.  Abdomen: Soft, non-tender, normal bowel sounds; no bruits, organomegaly or masses. clean, dry, intact and without erythema, induration, warmth, and tenderness Extremity: no edema   Assessment: 29 y.o. s/p cesarean section progressing well. She also was re-admitted for preeclampsia with severe features. She is taking procardia XL 90 mg daily. She notes that she checks her BP at home and it usually is in the normal range. Today she had a death in the family and is feeling more stressed.   Plan: Patient has done well after surgery with no apparent complications.  I have discussed the post-operative course to date, and the expected progress moving forward.  The patient understands what complications to be concerned about.    Activity plan: increase slowly Continue BP medication unchanged.   Return in about 3 weeks (around 01/30/2020) for Six Week Postpartum.  14/09/2019, MD 01/09/2020 10:48 AM

## 2020-01-22 ENCOUNTER — Other Ambulatory Visit: Payer: Self-pay

## 2020-01-22 ENCOUNTER — Ambulatory Visit (INDEPENDENT_AMBULATORY_CARE_PROVIDER_SITE_OTHER): Payer: Medicaid Other | Admitting: Obstetrics and Gynecology

## 2020-01-22 ENCOUNTER — Encounter: Payer: Self-pay | Admitting: Obstetrics and Gynecology

## 2020-01-22 DIAGNOSIS — O1495 Unspecified pre-eclampsia, complicating the puerperium: Secondary | ICD-10-CM

## 2020-01-22 MED ORDER — NIFEDIPINE ER 60 MG PO TB24: 60.0000 mg | ORAL_TABLET | Freq: Every day | ORAL | 1 refills | Status: DC

## 2020-01-22 NOTE — Progress Notes (Signed)
Postpartum Visit  Chief Complaint:  Chief Complaint  Patient presents with  . Postpartum Care    History of Present Illness: Patient is a 30 y.o. G1P1001 presents for postpartum visit.  Date of delivery: 12/14/2019 Type of delivery: C-section Episiotomy No.  Laceration:  Pregnancy or labor problems:  Obesity, hypertension Any problems since the delivery:  Pre-eclampsia  Newborn Details:  SINGLETON :  1. Baby's name: Pressley. Birth weight: 7.11lb Maternal Details:  Breast Feeding:  No/BOTTLE Post partum depression/anxiety noted: no Edinburgh Post-Partum Depression Score:  4  Date of last PAP: 05/18/2019/NORMAL  Past Medical History:  Diagnosis Date  . Adult BMI 40.0-44.9 kg/sq m (HCC)   . Anxiety   . GERD (gastroesophageal reflux disease)   . Herpes   . Obesity affecting pregnancy     Past Surgical History:  Procedure Laterality Date  . CESAREAN SECTION  12/14/2019   Procedure: CESAREAN SECTION;  Surgeon: Conard Novak, MD;  Location: [facility] ORS;  Service: Obstetrics;;  . NO PAST SURGERIES      Prior to Admission medications   Medication Sig Start Date End Date Taking? Authorizing Provider  docusate sodium (COLACE) 100 MG capsule Take 100 mg by mouth daily as needed for mild constipation.    Yes [provider]  enoxaparin (LOVENOX) 30 MG/0.3ML injection Inject 0.3 mLs (30 mg total) into the skin every 12 (twelve) hours. 12/16/19 01/27/20 Yes Tresea Mall, CNM  NIFEdipine (PROCARDIA XL/NIFEDICAL-XL) 90 MG 24 hr tablet Take 1 tablet (90 mg total) by mouth daily. 12/22/19  Yes Mirna Mires, CNM  Prenatal Vit-Fe Fumarate-FA (MULTIVITAMIN-PRENATAL) 27-0.8 MG TABS tablet Take 1 tablet by mouth daily at 12 noon.    Yes [provider]  acyclovir (ZOVIRAX) 400 MG tablet Take 400 mg by mouth as needed.  01/22/19   [provider]  fluticasone (FLONASE) 50 MCG/ACT nasal spray Place 2 sprays into both nostrils daily. As needed for nasal  congestion Patient not taking: Reported on 01/22/2020 01/08/15   Jolene Provost, MD  cetirizine (ZYRTEC) 10 MG tablet Take 10 mg by mouth daily.  06/04/19  [provider]    Allergies  Allergen Reactions  . Aluminum Hives and Rash  . Aluminum-Containing Compounds Hives  . Other     Trees, weeds, grass, dust mites.     Social History   Socioeconomic History  . Marital status: Significant Other    Spouse name: Derrick (FOB)  . Number of children: 0  . Years of education: Not on file  . Highest education level: Not on file  Occupational History  . 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 Smoking status: Never Smoker  . Smokeless tobacco: Never Used  Vaping Use  . Vaping Use: Never used  Substance and Sexual Activity  . Alcohol use: Not Currently  . Drug use: No  . Sexual activity: Yes    Birth control/protection: None    Comment: undecided  Other Topics Concern  . Not on file  Social History Narrative  . Not on file   Social Determinants of Health   Financial Resource Strain:   . Difficulty of Paying Living Expenses: Not on file  Food Insecurity:   . Worried About Programme researcher, broadcasting/film/video in the Last Year: Not on file  . Ran Out of Food in the Last Year: Not on file  Transportation Needs:   . Lack of Transportation (Medical): Not on file  . Lack of Transportation (  Non-Medical): Not on file  Physical Activity:   . Days of Exercise per Week: Not on file  . Minutes of Exercise per Session: Not on file  Stress:   . Feeling of Stress : Not on file  Social Connections:   . Frequency of Communication with Friends and Family: Not on file  . Frequency of Social Gatherings with Friends and Family: Not on file  . Attends Religious Services: Not on file  . Active Member of Clubs or Organizations: Not on file  . Attends Banker Meetings: Not on file  . Marital Status: Not on file  Intimate Partner Violence:   . Fear of Current  or Ex-Partner: Not on file  . Emotionally Abused: Not on file  . Physically Abused: Not on file  . Sexually Abused: Not on file    Family History  Problem Relation Age of Onset  . Heart Problems Mother   . Thyroid disease Mother   . Diabetes Maternal Grandmother   . Diabetes Maternal Grandfather   . Hypertension Maternal Grandfather   . Diabetes Maternal Aunt   . Diabetes Maternal Aunt     Review of Systems  Constitutional: Negative.   HENT: Negative.   Eyes: Negative.   Respiratory: Negative.   Cardiovascular: Negative.   Gastrointestinal: Negative.   Genitourinary: Negative.   Musculoskeletal: Negative.   Skin: Negative.   Neurological: Negative.   Psychiatric/Behavioral: Negative.      Physical Exam BP 126/84   Ht 5\' 5"  (1.651 m)   Wt 270 lb (122.5 kg)   BMI 44.93 kg/m   Physical Exam Constitutional:      General: She is not in acute distress.    Appearance: Normal appearance. She is well-developed.  HENT:     Head: Normocephalic and atraumatic.  Eyes:     General: No scleral icterus.    Conjunctiva/sclera: Conjunctivae normal.  Cardiovascular:     Rate and Rhythm: Normal rate and regular rhythm.     Heart sounds: No murmur heard.  No friction rub. No gallop.   Pulmonary:     Effort: Pulmonary effort is normal. No respiratory distress.     Breath sounds: Normal breath sounds. No wheezing or rales.  Abdominal:     General: Bowel sounds are normal. There is no distension.     Palpations: Abdomen is soft. There is no mass.     Tenderness: There is no abdominal tenderness. There is no guarding or rebound.     Comments: without erythema, induration, warmth, and tenderness. It is clean, dry, and intact.    Musculoskeletal:        General: Normal range of motion.     Cervical back: Normal range of motion and neck supple.  Neurological:     General: No focal deficit present.     Mental Status: She is alert and oriented to person, place, and time.     Cranial  Nerves: No cranial nerve deficit.  Skin:    General: Skin is warm and dry.     Findings: No erythema.  Psychiatric:        Mood and Affect: Mood normal.        Behavior: Behavior normal.        Judgment: Judgment normal.      Female Chaperone present during breast and/or pelvic exam.  Assessment: 29 y.o. G1P1001 presenting for 6 week postpartum visit  Plan: Problem List Items Addressed This Visit      Cardiovascular and Mediastinum  Preeclampsia in postpartum period   Relevant Medications   NIFEdipine (ADALAT CC) 60 MG 24 hr tablet    Other Visit Diagnoses    Postpartum care and examination    -  Primary   Relevant Medications   NIFEdipine (ADALAT CC) 60 MG 24 hr tablet     1) Contraception: would like to try combined OCPs. Will try Slynd for now until BP resolves. Marland Kitchen  2)  Pap - ASCCP guidelines and rational discussed.  Patient opts for routine screening interval  3) Patient underwent screening for postpartum depression with no concerns noted.  4) preeclampsia in postpartum period: lower nifedipine XL dose to 60 mg. If normal for next 2-3 weeks, let me know and we'll consider lowering dose to 30 mg.   5) Follow up 1 year for routine annual exam  Thomasene Mohair, MD 01/22/2020 3:20 PM

## 2020-01-25 NOTE — Addendum Note (Signed)
Addended by: Mirna Mires on: 01/25/2020 02:40 PM   Modules accepted: Level of Service

## 2020-05-20 ENCOUNTER — Other Ambulatory Visit: Payer: Self-pay

## 2020-05-20 ENCOUNTER — Encounter: Payer: Self-pay | Admitting: Obstetrics and Gynecology

## 2020-05-20 ENCOUNTER — Ambulatory Visit (INDEPENDENT_AMBULATORY_CARE_PROVIDER_SITE_OTHER): Payer: Medicaid Other | Admitting: Obstetrics and Gynecology

## 2020-05-20 VITALS — BP 128/72 | Ht 65.0 in | Wt 280.2 lb

## 2020-05-20 DIAGNOSIS — Z30011 Encounter for initial prescription of contraceptive pills: Secondary | ICD-10-CM | POA: Diagnosis not present

## 2020-05-20 DIAGNOSIS — Z3009 Encounter for other general counseling and advice on contraception: Secondary | ICD-10-CM | POA: Diagnosis not present

## 2020-05-20 LAB — POCT URINE PREGNANCY: Preg Test, Ur: NEGATIVE

## 2020-05-20 MED ORDER — NORETHIN ACE-ETH ESTRAD-FE 1-20 MG-MCG PO TABS: 1.0000 | ORAL_TABLET | Freq: Every day | ORAL | 11 refills | Status: DC

## 2020-05-20 NOTE — Patient Instructions (Signed)
Oral Contraception Use Oral contraceptive pills (OCPs) are medicines that prevent pregnancy. OCPs work by:  Preventing the ovaries from releasing eggs.  Thickening mucus in the lower part of the uterus (cervix). This prevents sperm from entering the uterus.  Thinning the lining of the uterus (endometrium). This prevents a fertilized egg from attaching to the endometrium. Discuss possible side effects of OCPs with your health care provider. It can take 2-3 months for your body to adjust to changes in hormone levels. What are the risks? OCPs can sometimes cause side effects, such as:  Headache.  Depression.  Trouble sleeping.  Nausea and vomiting.  Breast tenderness.  Irregular bleeding or spotting during the first several months.  Bloating or fluid retention.  Increase in blood pressure. OCPs with estrogen and progestins may slightly increase the risk of:  Blood clots.  Heart attack.  Stroke. How to take OCPs Follow instructions from your health care provider about how to take your first cycle of OCPs. There are 2 types of OCPs. The first, combination OCPs, have both estrogen and progestins. The second, progestin-only pills, have only progestin.  For combination OCPs, you may start the pill: ? On day 1 of your menstrual period. ? On the first Sunday after your period starts, or on the day you get your prescription. ? At any time of your cycle. ? If you start taking the pill within 5 days after the start of your period, you will not need a backup form of birth control, such as condoms. ? If you start at any other time of your menstrual cycle, you will need to use a backup form of birth control.  For progestin-only OCPs: ? Ideally, you can start taking the pill on the first day of your menstrual period, but you can start it on any other day too. ? These pills will protect you from pregnancy after taking it for 2 days (48 hours). You can stop using a backup form of birth  control after that time. It is important that you take this pill at the same time every day. Even taking it 3 hours late can increase the risk of pregnancy. No matter which day you start the OCP, you will always start a new pack on that same day of the week. Have an extra pack of OCPs and a backup contraceptive method available in case you miss some pills or lose your OCP pack. Missed doses Follow instructions from your health care provider for missed doses. Information about missed doses can also be found in the patient information sheet that comes with your pack of pills.  In general, for combined OCPs: ? If you forget to take the pill for 1 day, take it as soon as you can. This may mean taking 2 pills on the same day and at the same time. Take the next day's pill at the regular time. ? If you forget to take the pill for 2 days in a row, take 2 tablets on the day you remember and 2 tablets on the following day. A backup form of birth control should be used for 7 days after you are back on schedule. ? If you forget to take the pill for 3 days in a row, call your health care provider for directions on when to restart taking your pills. Do not take the missed pills. A backup form of birth control will be needed for 7 days once you restart your pills. ? If you use a pack that   contains inactive pills and you miss 1 or more of the inactive pills, you do not need to take the missed doses. Skip them and start the new pack on the regular day.  For progestin-only OCPs: ? If your dose is 3 hours or more late, or if you miss 1 or more doses, take 1 missed pill as soon as you can. ? If you miss one or more doses, you must use a backup form of birth control. Some brands of progestin-only pills recommend using a backup form of birth control for 48 hours after a missed or late dose while others recommend 7 days. If you are not sure what to do, call your health care provider or check the patient information sheet that  came with your pills.   Follow these instructions at home:  Do not use any products that contain nicotine or tobacco. These include cigarettes, chewing tobacco, or vaping devices, such as e-cigarettes. If you need help quitting, ask your health care provider.  Always use a condom to protect against STIs (sexually transmitted infections). Oral contraception pills do not protect against STIs.  Use a calendar to mark the days of your menstrual period.  Read the information sheet and directions that came with your OCP. Talk to your health care provider if you have questions. Contact a health care provider if:  You develop nausea and vomiting.  You have abnormal vaginal discharge or bleeding.  You develop a rash.  You miss your menstrual period. Depending on the type of OCP you are taking, this may be a sign of pregnancy.  You are losing your hair.  You need treatment for mood swings or depression.  You get dizzy when taking the OCP.  You develop acne after taking the OCP.  You become pregnant or think you may be pregnant.  You have diarrhea, constipation, and abdominal pain or cramps.  You are not sure what to do after missing pills. Get help right away if:  You develop chest pain.  You develop shortness of breath.  You have an uncontrolled or severe headache.  You develop numbness or slurred speech.  You develop vision or speech problems.  You develop pain, redness, and swelling in your legs.  You develop weakness or numbness in your arms or legs. These symptoms may represent a serious problem that is an emergency. Do not wait to see if the symptoms will go away. Get medical help right away. Call your local emergency services (911 in the [place] Do not drive yourself to the hospital. Summary  Oral contraceptive pills (OCPs) are medicines that you take to prevent pregnancy.  OCPs do not prevent sexually transmitted infections (STIs). Always use a condom to protect  against STIs.  When you start an OCP, be aware that it can take 2-3 months for your body to adjust to changes in hormone levels.  Read all the information and directions that come with your OCP. This information is not intended to replace advice given to you by your health care provider. Make sure you discuss any questions you have with your health care provider. Document Revised: 10/18/2019 Document Reviewed: 10/18/2019 Elsevier Patient Education  2021 Elsevier Inc.  

## 2020-05-20 NOTE — Progress Notes (Signed)
Patient ID: Kelly Baxter, female   DOB: 12/17/1990, 30 y.o.   MRN: [id number]  Reason for Consult: Contraception   Referred by Rushie Chestnut, PA-C  Subjective:     HPI:  Kelly Baxter is a 30 y.o. female. She reports that she has been taking slynd. She was having daily bleeding with it. She ran out of it 2 weeks ago. She was on Junel in the past and would like to restart that medication. She was taking procardia for postpartum preeclampsia, but discontinued that in January and has not had issues with elevated BP since then. She denies other medical problems.    Past Medical History:  Diagnosis Date  . Adult BMI 40.0-44.9 kg/sq m (HCC)   . Anxiety   . GERD (gastroesophageal reflux disease)   . Herpes   . Obesity affecting pregnancy    Family History  Problem Relation Age of Onset  . Heart Problems Mother   . Thyroid disease Mother   . Diabetes Maternal Grandmother   . Diabetes Maternal Grandfather   . Hypertension Maternal Grandfather   . Diabetes Maternal Aunt   . Diabetes Maternal Aunt    Past Surgical History:  Procedure Laterality Date  . CESAREAN SECTION  12/14/2019   Procedure: CESAREAN SECTION;  Surgeon: Conard Novak, MD;  Location: [facility] ORS;  Service: Obstetrics;;  . NO PAST SURGERIES      Short Social History:  Social History   Tobacco Use  . Smoking status: Never Smoker  . Smokeless tobacco: Never Used  Substance Use Topics  . Alcohol use: Not Currently    Allergies  Allergen Reactions  . Aluminum Hives and Rash  . Aluminum-Containing Compounds Hives  . Other     Trees, weeds, grass, dust mites.    Current Outpatient Medications  Medication Sig Dispense Refill  . docusate sodium (COLACE) 100 MG capsule Take 100 mg by mouth daily as needed for mild constipation.     . fluticasone (FLONASE) 50 MCG/ACT nasal spray Place 2 sprays into both nostrils daily. As needed for nasal congestion 1 g 1  . acyclovir (ZOVIRAX) 400 MG tablet Take  400 mg by mouth as needed.  (Patient not taking: Reported on 05/20/2020)    . enoxaparin (LOVENOX) 30 MG/0.3ML injection Inject 0.3 mLs (30 mg total) into the skin every 12 (twelve) hours. 12.6 mL 1  . NIFEdipine (ADALAT CC) 60 MG 24 hr tablet Take 1 tablet (60 mg total) by mouth daily. 90 tablet 1  . Prenatal Vit-Fe Fumarate-FA (MULTIVITAMIN-PRENATAL) 27-0.8 MG TABS tablet Take 1 tablet by mouth daily at 12 noon.  (Patient not taking: Reported on 05/20/2020)     No current facility-administered medications for this visit.    Review of Systems  Constitutional: Negative for chills, fatigue, fever and unexpected weight change.  HENT: Negative for trouble swallowing.  Eyes: Negative for loss of vision.  Respiratory: Negative for cough, shortness of breath and wheezing.  Cardiovascular: Negative for chest pain, leg swelling, palpitations and syncope.  GI: Negative for abdominal pain, blood in stool, diarrhea, nausea and vomiting.  GU: Negative for difficulty urinating, dysuria, frequency and hematuria.  Musculoskeletal: Negative for back pain, leg pain and joint pain.  Skin: Negative for rash.  Neurological: Negative for dizziness, headaches, light-headedness, numbness and seizures.  Psychiatric: Negative for behavioral problem, confusion, depressed mood and sleep disturbance.        Objective:  Objective   Vitals:   05/20/20 1059  BP: 128/72  Weight: 280 lb 3.2 oz (127.1 kg)  Height: 5\' 5"  (1.651 m)   Body mass index is 46.63 kg/m.  Physical Exam Vitals and nursing note reviewed. Exam conducted with a chaperone present.  Constitutional:      Appearance: Normal appearance.  HENT:     Head: Normocephalic and atraumatic.  Eyes:     Extraocular Movements: Extraocular movements intact.     Pupils: Pupils are equal, round, and reactive to light.  Cardiovascular:     Rate and Rhythm: Normal rate and regular rhythm.  Pulmonary:     Effort: Pulmonary effort is normal.     Breath  sounds: Normal breath sounds.  Abdominal:     General: Abdomen is flat.     Palpations: Abdomen is soft.  Musculoskeletal:     Cervical back: Normal range of motion.  Skin:    General: Skin is warm and dry.  Neurological:     General: No focal deficit present.     Mental Status: She is alert and oriented to person, place, and time.  Psychiatric:        Behavior: Behavior normal.        Thought Content: Thought content normal.        Judgment: Judgment normal.     Assessment/Plan:     30 yo here for birth control consultation Discussed options for birth control. She would like to restart OCP. Reviewed pill taking technique.  Pregnancy test was negative today.  Follow up as needed  More than 10 minutes were spent face to face with the patient in the room, reviewing the medical record, labs and images, and coordinating care for the patient. The plan of management was discussed in detail and counseling was provided.     37 MD [facility], [facility] 05/20/2020 11:13 AM

## 2021-03-26 ENCOUNTER — Ambulatory Visit: Payer: Medicaid Other | Admitting: Licensed Practical Nurse

## 2021-04-21 ENCOUNTER — Other Ambulatory Visit: Payer: Self-pay

## 2021-04-24 ENCOUNTER — Encounter: Payer: Medicaid Other | Admitting: Obstetrics

## 2021-04-28 ENCOUNTER — Encounter: Payer: Self-pay | Admitting: Advanced Practice Midwife

## 2021-04-28 ENCOUNTER — Ambulatory Visit (INDEPENDENT_AMBULATORY_CARE_PROVIDER_SITE_OTHER): Payer: Medicaid Other | Admitting: Advanced Practice Midwife

## 2021-04-28 ENCOUNTER — Other Ambulatory Visit (HOSPITAL_COMMUNITY)
Admission: RE | Admit: 2021-04-28 | Discharge: 2021-04-28 | Disposition: A | Discharge status: Home / Self Care | Payer: Medicaid Other | Source: Ambulatory Visit | Attending: Obstetrics | Admitting: Obstetrics

## 2021-04-28 ENCOUNTER — Other Ambulatory Visit: Payer: Self-pay

## 2021-04-28 VITALS — BP 120/80 | Wt 263.0 lb

## 2021-04-28 DIAGNOSIS — Z369 Encounter for antenatal screening, unspecified: Secondary | ICD-10-CM

## 2021-04-28 DIAGNOSIS — Z113 Encounter for screening for infections with a predominantly sexual mode of transmission: Secondary | ICD-10-CM | POA: Insufficient documentation

## 2021-04-28 DIAGNOSIS — O0991 Supervision of high risk pregnancy, unspecified, first trimester: Secondary | ICD-10-CM | POA: Diagnosis not present

## 2021-04-28 DIAGNOSIS — O09291 Supervision of pregnancy with other poor reproductive or obstetric history, first trimester: Secondary | ICD-10-CM

## 2021-04-28 DIAGNOSIS — O99211 Obesity complicating pregnancy, first trimester: Secondary | ICD-10-CM | POA: Diagnosis not present

## 2021-04-28 DIAGNOSIS — O99210 Obesity complicating pregnancy, unspecified trimester: Secondary | ICD-10-CM | POA: Insufficient documentation

## 2021-04-28 DIAGNOSIS — Z3490 Encounter for supervision of normal pregnancy, unspecified, unspecified trimester: Secondary | ICD-10-CM | POA: Diagnosis not present

## 2021-04-28 DIAGNOSIS — O0990 Supervision of high risk pregnancy, unspecified, unspecified trimester: Secondary | ICD-10-CM | POA: Insufficient documentation

## 2021-04-28 DIAGNOSIS — Z3A10 10 weeks gestation of pregnancy: Secondary | ICD-10-CM

## 2021-04-28 DIAGNOSIS — Z1159 Encounter for screening for other viral diseases: Secondary | ICD-10-CM

## 2021-04-28 LAB — POCT URINE PREGNANCY: Preg Test, Ur: POSITIVE — AB

## 2021-04-28 NOTE — Patient Instructions (Signed)

## 2021-04-29 NOTE — Progress Notes (Signed)
New Obstetric Patient H&P  Date of Service: 04/28/2021  Chief Complaint: "Desires prenatal care"   History of Present Illness: Patient is a 31 y.o. G45P1001 Not Hispanic or Latino female, presents with amenorrhea and positive home pregnancy test. Patient's last menstrual period was 02/15/2021 (within weeks). and based on her  LMP, her EDD is Estimated Date of Delivery: 11/22/21 and her EGA is [redacted]w[redacted]d. Cycles are 4-6days, regular, and occur approximately every : 28 days. Her last pap smear was 2 years ago and was no abnormalities.    She had a urine pregnancy test which was positive 3 or 4 week(s)  ago. Her last menstrual period was abnormal and lasted for 1 month. Since her LMP she claims she has experienced breast tenderness, fatigue, nausea vomiting. She denies vaginal bleeding. Her past medical history is noncontributory. Her prior pregnancies are notable for  c/s for fetal intolerance.  She had gestational hypertension and developed postpartum preeclampsia. She started Procardia and was readmitted for magnesium therapy.  Since her LMP, she admits to the use of tobacco products  no She claims she has gained  6  pounds since the start of her pregnancy.  There are cats in the home in the home  no  She admits close contact with children on a regular basis  yes  She has had chicken pox in the past yes She has had Tuberculosis exposures, symptoms, or previously tested positive for TB   no Current or past history of domestic violence. no  Genetic Screening/Teratology Counseling: (Includes patient, baby's father, or anyone in either family with:)   1. Patient's age >/= 65 at [facility]  no 2. Thalassemia ([place], [place], Mediterranean, or Asian background): MCV<80  no 3. Neural tube defect (meningomyelocele, spina bifida, anencephaly)  no 4. Congenital heart defect  no  5. Down syndrome  no 6. Tay-Sachs (Jewish, [place]  no 7. Canavan's Disease  no 8. Sickle cell disease or trait (African)  no   9. Hemophilia or other blood disorders  no  10. Muscular dystrophy  no  11. Cystic fibrosis  no  12. Huntington's Chorea  no  13. Mental retardation/autism  no 14. Other inherited genetic or chromosomal disorder  no 15. Maternal metabolic disorder (DM, PKU, etc)  no 16. Patient or FOB with a child with a birth defect not listed above no  16a. Patient or FOB with a birth defect themselves no 17. Recurrent pregnancy loss, or stillbirth  no  18. Any medications since LMP other than prenatal vitamins (include vitamins, supplements, OTC meds, drugs, alcohol)  no 19. Any other genetic/environmental exposure to discuss  no  Infection History:   1. Lives with someone with TB or TB exposed  no  2. Patient or partner has history of genital herpes  yes 3. Rash or viral illness since LMP  no 4. History of STI (GC, CT, HPV, syphilis, HIV)  no 5. History of recent travel :  no  Other pertinent information:  She may be interested in trial of labor    Review of Systems:10 point review of systems negative unless otherwise noted in HPI  Past Medical History:  Patient Active Problem List   Diagnosis Date Noted   Obesity affecting pregnancy    Supervision of high-risk pregnancy      Nursing Staff Provider  Office Location  [facility]ating    Language  English Anatomy US    Flu Vaccine   Genetic Screen  NIPS:   TDaP vaccine  Hgb A1C or  GTT Early : Third trimester :   Covid    LAB RESULTS   Rhogam  Rh positive Blood Type     Feeding Plan Breast Antibody    Contraception  Rubella    Circumcision  RPR     Pediatrician   HBsAg     Support Person Derrick HIV    Prenatal Classes  Varicella     GBS  (For PCN allergy, check sensitivities)   BTL Consent     VBAC Consent considering Pap  2021 negative    Hgb Electro    Pelvis Tested  CF      SMA            Adult BMI 40.0-44.9 kg/sq m (HCC) 05/20/2019   Genital herpes simplex 05/20/2019    Prophylaxis at 36 weeks    Anxiety,  generalized 02/07/2017    Past Surgical History:  Past Surgical History:  Procedure Laterality Date   CESAREAN SECTION  12/14/2019   Procedure: CESAREAN SECTION;  Surgeon: Conard Novak, MD;  Location: [facility] ORS;  Service: Obstetrics;;   NO PAST SURGERIES      Gynecologic History: Patient's last menstrual period was 02/15/2021 (within weeks).  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  Tobacco Use   Smoking status: Never   Smokeless tobacco: Never  Vaping Use   Vaping Use: Never used  Substance and Sexual Activity   Alcohol use: Not Currently   Drug use: No   Sexual activity: Yes    Birth control/protection: None    Comment: undecided  Other Topics Concern   Not on file  Social History Narrative   Not on file   Social Determinants of Health   Financial Resource Strain: Not on file  Food Insecurity: Not on file  Transportation Needs: Not on file  Physical Activity: Not on file  Stress: Not on file  Social Connections: Not on file  Intimate Partner Violence: Not on file    Allergies:  Allergies  Allergen Reactions   Aluminum Hives and Rash   Aluminum-Containing Compounds Hives   Other     Trees, weeds, grass, dust mites.    Medications: Prior to Admission medications   Medication Sig Start Date End Date Taking? Authorizing Provider  Prenatal Vit-Fe Fumarate-FA (MULTIVITAMIN-PRENATAL) 27-0.8 MG  TABS tablet Take 1 tablet by mouth daily at 12 noon.   Yes [provider]  cetirizine (ZYRTEC) 10 MG tablet Take 10 mg by mouth daily.  06/04/19  [provider]    Physical Exam Vitals: Blood pressure 120/80, weight 263 lb (119.3 kg), last menstrual period 02/15/2021  General: NAD HEENT: normocephalic, anicteric Thyroid: no enlargement, no palpable nodules Pulmonary: No increased work of breathing, CTAB Cardiovascular: RRR, distal pulses 2+ Abdomen: NABS, soft, non-tender, non-distended.  Umbilicus without lesions.  No hepatomegaly, splenomegaly or  masses palpable. No evidence of hernia  Genitourinary:  External: Normal external female genitalia.  Normal urethral meatus, normal  Bartholin's and Skene's glands.    Vagina: Normal vaginal mucosa, no evidence of prolapse.   Extremities: no edema, erythema, or tenderness Neurologic: Grossly intact Psychiatric: mood appropriate, affect full   The following were addressed during this visit:  Breastfeeding Education - Early initiation of breastfeeding    Comments: Keeps milk supply adequate, helps contract uterus and slow bleeding, and early milk is the perfect first food and is easy to digest.   - The importance of exclusive breastfeeding    Comments: Provides antibodies, Lower risk of breast and ovarian cancers, and type-2 diabetes,Helps your body recover, Reduced chance of SIDS.   - Risks of giving your baby anything other than breast milk if you are breastfeeding    Comments: Make the baby less content with breastfeeds, may make my baby more susceptible to illness, and may reduce my milk supply.   - The importance of early skin-to-skin contact    Comments:  Keeps baby warm and secure, helps keep babys blood sugar up and breathing steady, easier to bond and breastfeed, and helps calm baby.  - Rooming-in on a 24-hour basis    Comments: Easier to learn baby's feeding cues, easier to bond and get to know each  other, and encourages milk production.   - Feeding on demand or baby-led feeding    Comments: Helps prevent breastfeeding complications, helps bring in good milk supply, prevents under or overfeeding, and helps baby feel content and satisfied   - Frequent feeding to help assure optimal milk production    Comments: Making a full supply of milk requires frequent removal of milk from breasts, infant will eat 8-12 times in 24 hours, if separated from infant use breast massage, hand expression and/ or pumping to remove milk from breasts.   - Effective positioning and attachment    Comments: Helps my baby to get enough breast milk, helps to produce an adequate milk supply, and helps prevent nipple pain and damage   - Exclusive breastfeeding for the first 6 months    Comments: Builds a healthy milk supply and keeps it up, protects baby from sickness and disease, and breastmilk has everything your baby needs for the first 6 months.  - Individualized Education    Comments: Contraindications to breastfeeding and other special medical conditions She did not breastfeed her first baby and is interested in doing so with this baby     Assessment: 32 y.o. G2P1001 at [redacted]w[redacted]d presenting to initiate prenatal care  Plan: 1) Avoid alcoholic beverages. 2) Patient encouraged not to smoke.  3) Discontinue the use of all non-medicinal drugs and chemicals.  4) Take prenatal vitamins daily.  5) Nutrition, food safety (fish, cheese advisories, and high nitrite foods) and exercise discussed. 6) Hospital and practice style discussed with cross coverage system.  7) Genetic Screening, such as with 1st Trimester Screening, cell free fetal DNA, AFP testing, and Ultrasound, as well as with amniocentesis and CVS as appropriate, is discussed with patient. At the conclusion of today's visit patient requested genetic testing 8) Patient is asked about travel to areas at risk for the Zika virus, and counseled to avoid  travel and exposure to mosquitoes or sexual partners who may have themselves been exposed to the virus. Testing is discussed, and will be ordered as appropriate.  9) Urine culture, aptima today 10) Return to clinic in 1 week for dating ultrasound,  rob and labs (NOB panel, CMP, Urine Protein/Creatinine ratio future ordered) 11) MaterniT 21 at 10+ weeks   Tresea Mall, CNM [facility] 04/29/2021, 2:28 PM

## 2021-04-30 LAB — URINE CULTURE

## 2021-04-30 LAB — CERVICOVAGINAL ANCILLARY ONLY
Chlamydia: NEGATIVE
Comment: NEGATIVE
Comment: NEGATIVE
Comment: NORMAL
Neisseria Gonorrhea: NEGATIVE
Trichomonas: NEGATIVE

## 2021-05-05 ENCOUNTER — Ambulatory Visit
Admission: RE | Admit: 2021-05-05 | Discharge: 2021-05-05 | Disposition: A | Discharge status: Home / Self Care | Payer: Medicaid Other | Source: Ambulatory Visit | Attending: Advanced Practice Midwife | Admitting: Advanced Practice Midwife

## 2021-05-05 DIAGNOSIS — Z3687 Encounter for antenatal screening for uncertain dates: Secondary | ICD-10-CM | POA: Diagnosis present

## 2021-05-05 DIAGNOSIS — Z3490 Encounter for supervision of normal pregnancy, unspecified, unspecified trimester: Secondary | ICD-10-CM

## 2021-05-05 DIAGNOSIS — Z369 Encounter for antenatal screening, unspecified: Secondary | ICD-10-CM

## 2021-05-05 DIAGNOSIS — Z3A01 Less than 8 weeks gestation of pregnancy: Secondary | ICD-10-CM | POA: Diagnosis not present

## 2021-05-05 DIAGNOSIS — O0991 Supervision of high risk pregnancy, unspecified, first trimester: Secondary | ICD-10-CM

## 2021-05-06 ENCOUNTER — Telehealth: Payer: Self-pay | Admitting: Advanced Practice Midwife

## 2021-05-06 NOTE — Telephone Encounter (Signed)
Pt called in about whether [facility] had [place] services.  She was advised that we have had [place] services on Thursdays in March.  I advised her that I am not sure about [place] services in April.  Pt stated that she had an [place] at [facility] and had the following complaints-her support person was allowed to come back with her, she could not see a screen to see her baby, and she was not given a print out of pictures.  She wanted to scheduled an [place] here at [facility].  Pt stated that we were giving her the  (insert bad language) round around.  I asked who was giving her the run around and she stated that it was [facility] and [facility].  She stated that she was not having the joyous experience that she had with her last pregnancy.  I told her that she did not have an additional order for another [place].  I advised that she would have an [place] at around 20 weeks or when a provider deemed it necessary.  She wanted to know  who she could call to report malpractice.  I tried to send her to [employer], but she refused.  She wanted to speak with an actual person.  She wanted to speak to the next "manager" which would have been Crystal.  Crystal is not in the office today.  She is at [facility] [place].  The pt hung up while I was determining who to transfer the call to.   ?

## 2021-05-12 ENCOUNTER — Encounter: Payer: Medicaid Other | Admitting: Licensed Practical Nurse

## 2021-05-18 ENCOUNTER — Encounter: Payer: Self-pay | Admitting: Emergency Medicine

## 2021-05-18 ENCOUNTER — Other Ambulatory Visit: Payer: Self-pay

## 2021-05-18 ENCOUNTER — Emergency Department
Admission: EM | Admit: 2021-05-18 | Discharge: 2021-05-18 | Disposition: A | Discharge status: Home / Self Care | Payer: Medicaid Other | Attending: Emergency Medicine | Admitting: Emergency Medicine

## 2021-05-18 DIAGNOSIS — Z3A08 8 weeks gestation of pregnancy: Secondary | ICD-10-CM | POA: Diagnosis not present

## 2021-05-18 DIAGNOSIS — O219 Vomiting of pregnancy, unspecified: Secondary | ICD-10-CM | POA: Diagnosis not present

## 2021-05-18 DIAGNOSIS — O210 Mild hyperemesis gravidarum: Secondary | ICD-10-CM

## 2021-05-18 LAB — COMPREHENSIVE METABOLIC PANEL
ALT: 15 U/L (ref 0–44)
AST: 20 U/L (ref 15–41)
Albumin: 4.1 g/dL (ref 3.5–5.0)
Alkaline Phosphatase: 80 U/L (ref 38–126)
Anion gap: 10 (ref 5–15)
BUN: 7 mg/dL (ref 6–20)
CO2: 22 mmol/L (ref 22–32)
Calcium: 9.1 mg/dL (ref 8.9–10.3)
Chloride: 103 mmol/L (ref 98–111)
Creatinine, Ser: 0.65 mg/dL (ref 0.44–1.00)
GFR, Estimated: >60 mL/min (ref >60)
Glucose, Bld: 114 mg/dL — ABNORMAL HIGH (ref 70–99)
Potassium: 3.6 mmol/L (ref 3.5–5.1)
Sodium: 135 mmol/L (ref 135–145)
Total Bilirubin: 0.5 mg/dL (ref 0.3–1.2)
Total Protein: 7.1 g/dL (ref 6.5–8.1)

## 2021-05-18 LAB — URINALYSIS, ROUTINE W REFLEX MICROSCOPIC
Bilirubin Urine: NEGATIVE
Glucose, UA: NEGATIVE mg/dL
Hgb urine dipstick: NEGATIVE
Ketones, ur: NEGATIVE mg/dL
Leukocytes,Ua: NEGATIVE
Nitrite: NEGATIVE
Protein, ur: NEGATIVE mg/dL
Specific Gravity, Urine: 1.011 (ref 1.005–1.030)
pH: 6 (ref 5.0–8.0)

## 2021-05-18 LAB — CBC
HCT: 39 % (ref 36.0–46.0)
Hemoglobin: 12.8 g/dL (ref 12.0–15.0)
MCH: 28.7 pg (ref 26.0–34.0)
MCHC: 32.8 g/dL (ref 30.0–36.0)
MCV: 87.4 fL (ref 80.0–100.0)
Platelets: 233 10*3/uL (ref 150–400)
RBC: 4.46 MIL/uL (ref 3.87–5.11)
RDW: 13.5 % (ref 11.5–15.5)
WBC: 7.6 10*3/uL (ref 4.0–10.5)
nRBC: 0 % (ref 0.0–0.2)

## 2021-05-18 LAB — LIPASE, BLOOD: Lipase: 27 U/L (ref 11–51)

## 2021-05-18 MED ORDER — PROMETHAZINE HCL 12.5 MG RE SUPP: 12.5000 mg | Freq: Four times a day (QID) | RECTAL | 0 refills | Status: DC | PRN

## 2021-05-18 MED ORDER — SODIUM CHLORIDE 0.9 % IV SOLN
1000.0000 mL | Freq: Once | INTRAVENOUS | Status: AC
  Administered 2021-05-18: 1000 mL via INTRAVENOUS

## 2021-05-18 MED ORDER — ONDANSETRON HCL 4 MG/2ML IJ SOLN
4.0000 mg | Freq: Once | INTRAMUSCULAR | Status: AC
  Administered 2021-05-18: 4 mg via INTRAVENOUS
  Filled 2021-05-18: qty 2

## 2021-05-18 NOTE — ED Notes (Signed)
Specimen cup provided, pt encouraged to provide urine sample per order.  ?

## 2021-05-18 NOTE — ED Provider Notes (Signed)
? ?[facility] ?Provider Note ? ? ? Event Date/Time  ? First MD Initiated Contact with Patient 05/18/21 1138   ?  (approximate) ? ? ?History  ? ?Emesis During Pregnancy ? ? ?HPI ? ?Kelly Baxter is a 31 y.o. female who reports she is approximately [redacted] weeks pregnant who notes that she has been having morning sickness throughout this pregnancy.  Recently she has been feeling quite dehydrated and so presented to the emergency department for IV fluids.  No vaginal bleeding. ?  ? ? ?Physical Exam  ? ?Triage Vital Signs: ?ED Triage Vitals  ?Enc Vitals Group  ?   BP 05/18/21 1225 99/62  ?   Pulse Rate 05/18/21 1225 60  ?   Resp 05/18/21 1225 20  ?   Temp 05/18/21 1225 97.9 ?F (36.6 ?C)  ?   Temp Source 05/18/21 1225 Oral  ?   SpO2 05/18/21 1225 99 %  ?   Weight 05/18/21 1111 119.2 kg (262 lb 12.6 oz)  ?   Height 05/18/21 1111 1.651 m (5\' 5" )  ?   Head Circumference --   ?   Peak Flow --   ?   Pain Score 05/18/21 1110 4  ?   Pain Loc --   ?   Pain Edu? --   ?   Excl. in [place]? --   ? ? ?Most recent vital signs: ?Vitals:  ? 05/18/21 1225  ?BP: 99/62  ?Pulse: 60  ?Resp: 20  ?Temp: 97.9 ?F (36.6 ?C)  ?SpO2: 99%  ? ? ? ?General: Awake, no distress.  ?CV:  Good peripheral perfusion.  ?Resp:  Normal effort.  ?Abd:  No distention.  Gravid appearance, no tenderness palpation ?Other:   ? ? ?ED Results / Procedures / Treatments  ? ?Labs ?(all labs ordered are listed, but only abnormal results are displayed) ?Labs Reviewed  ?COMPREHENSIVE METABOLIC PANEL - Abnormal; Notable for the following components:  ?    Result Value  ? Glucose, Bld 114 (*)   ? All other components within normal limits  ?URINALYSIS, ROUTINE W REFLEX MICROSCOPIC - Abnormal; Notable for the following components:  ? Color, Urine YELLOW (*)   ? APPearance CLEAR (*)   ? All other components within normal limits  ?LIPASE, BLOOD  ?CBC  ? ? ? ?EKG ? ? ? ? ?RADIOLOGY ? ? ? ? ?PROCEDURES: ? ?Critical Care performed:  ? ?Procedures ? ? ?MEDICATIONS  ORDERED IN ED: ?Medications  ?0.9 %  sodium chloride infusion (1,000 mLs Intravenous New Bag/Given 05/18/21 1216)  ?ondansetron [facility]) injection 4 mg (4 mg Intravenous Given 05/18/21 1216)  ? ? ? ?IMPRESSION / MDM / ASSESSMENT AND PLAN / ED COURSE  ?I reviewed the triage vital signs and the nursing notes. ? ? ? ?Patient presents with nausea vomiting in the setting of her stress most of her pregnancy consistent with morning sickness.  Overall she is well-appearing with reassuring exam.  Vital signs are unremarkable. ? ?Lab work reviewed and is unremarkable as well.  Will treat with IV fluids, single dose of IV Zofran and reevaluate. ? ?----------------------------------------- ?1:24 PM on 05/18/2021 ?----------------------------------------- ? ?Patient is feeling improved, has tolerated p.o.'s, considered admission however patient is appropriate for discharge at this time given improvement in symptoms.  Return precautions discussed.  Prescription provided ? ? ?  ? ? ?FINAL CLINICAL IMPRESSION(S) / ED DIAGNOSES  ? ?Final diagnoses:  ?Morning sickness  ? ? ? ?Rx / DC Orders  ? ?ED Discharge  Orders   ? ?      Ordered  ?  promethazine (PHENERGAN) 12.5 MG suppository  Every 6 hours PRN       ? 05/18/21 1321  ? ?  ?  ? ?  ? ? ? ?Note:  This document was prepared using Dragon voice recognition software and may include unintentional dictation errors. ?  ?Lavonia Drafts, MD ?05/18/21 1324 ? ?

## 2021-05-18 NOTE — ED Notes (Signed)
Pt d/c home per MD order. Discharge summary reviewed with pt, pt verbalizes understanding. Ambulatory off unit. No s/s of acute distress noted at discharge.,  °

## 2021-05-18 NOTE — ED Triage Notes (Signed)
C/O vomiting through pregnancy EDC:  12/23/2021.  G2 P1.  [facility] for [employer] care.  Has not taken anything for nausea today. ? ?AAOx3.  Skin warm and dyr. NAd ?

## 2021-10-27 ENCOUNTER — Ambulatory Visit (INDEPENDENT_AMBULATORY_CARE_PROVIDER_SITE_OTHER): Payer: Medicaid Other | Admitting: Obstetrics & Gynecology

## 2021-10-27 DIAGNOSIS — Z3A31 31 weeks gestation of pregnancy: Secondary | ICD-10-CM

## 2021-10-27 DIAGNOSIS — O26843 Uterine size-date discrepancy, third trimester: Secondary | ICD-10-CM

## 2021-10-27 LAB — POCT URINALYSIS DIPSTICK OB
Glucose, UA: NEGATIVE
POC,PROTEIN,UA: NEGATIVE

## 2021-10-27 NOTE — Progress Notes (Signed)
Subjective:    Kelly Baxter is a 31 y.o. female being seen today for her obstetrical visit. She is at [redacted]w[redacted]d gestation. Patient reports no complaints. Fetal movement: normal.  Menstrual History: OB History     Gravida  2   Para  1   Term  1   Preterm      AB      Living  1      SAB      IAB      Ectopic      Multiple  0   Live Births  1            Patient's last menstrual period was 02/15/2021 (within weeks).    The following portions of the patient's history were reviewed and updated as appropriate: allergies, current medications, past family history, past medical history, past social history, past surgical history, and problem list.  Review of Systems Pertinent items noted in HPI and remainder of comprehensive ROS otherwise negative.   Objective:    LMP 02/15/2021 (Within Weeks)  FHT:  138 BPM  Uterine Size: 33 cm       Assessment:    Pregnancy 31 and 6/7 weeks   Plan:    28-week labs reviewed, normal PTL precautions Follow up as scheduled

## 2021-11-03 ENCOUNTER — Ambulatory Visit
Admission: RE | Admit: 2021-11-03 | Discharge: 2021-11-03 | Disposition: A | Discharge status: Home / Self Care | Payer: Medicaid Other | Source: Ambulatory Visit | Attending: Obstetrics & Gynecology | Admitting: Obstetrics & Gynecology

## 2021-11-03 DIAGNOSIS — O26843 Uterine size-date discrepancy, third trimester: Secondary | ICD-10-CM | POA: Insufficient documentation

## 2021-11-10 ENCOUNTER — Ambulatory Visit (INDEPENDENT_AMBULATORY_CARE_PROVIDER_SITE_OTHER): Payer: Medicaid Other | Admitting: Obstetrics

## 2021-11-10 VITALS — BP 126/84 | Wt 278.0 lb

## 2021-11-10 DIAGNOSIS — O0990 Supervision of high risk pregnancy, unspecified, unspecified trimester: Secondary | ICD-10-CM

## 2021-11-10 DIAGNOSIS — Z3A33 33 weeks gestation of pregnancy: Secondary | ICD-10-CM

## 2021-11-10 DIAGNOSIS — O99211 Obesity complicating pregnancy, first trimester: Secondary | ICD-10-CM

## 2021-11-10 NOTE — Progress Notes (Signed)
Routine Prenatal Care Visit  Subjective  Kelly Baxter is a 31 y.o. G2P1001 at [redacted]w[redacted]d being seen today for ongoing prenatal care.  She is currently monitored for the following issues for this high-risk pregnancy and has Adult BMI 40.0-44.9 kg/sq m (HCC); Genital herpes simplex; Anxiety, generalized; Obesity affecting pregnancy; and Supervision of high-risk pregnancy on their problem list.  ----------------------------------------------------------------------------------- Patient reports no complaints.  She has had some care at [facility] due to inability to get appts at [facility]. No records available today.Desires repeat CS and BTL Having a boy, named [name]. Contractions: Not present. Vag. Bleeding: None.  Movement: Present. Leaking Fluid denies.  ----------------------------------------------------------------------------------- The following portions of the patient's history were reviewed and updated as appropriate: allergies, current medications, past family history, past medical history, past social history, past surgical history and problem list. Problem list updated.  Objective  Blood pressure 126/84, weight 278 lb (126.1 kg), last menstrual period 02/15/2021, unknown if currently breastfeeding. Pregravid weight 256 lb (116.1 kg) Total Weight Gain 22 lb (9.979 kg) Urinalysis: Urine Protein    Urine Glucose    Fetal Status:     Movement: Present     General:  Alert, oriented and cooperative. Patient is in no acute distress.  Skin: Skin is warm and dry. No rash noted.   Cardiovascular: Normal heart rate noted  Respiratory: Normal respiratory effort, no problems with respiration noted  Abdomen: Soft, gravid, appropriate for gestational age. Pain/Pressure: Absent     Pelvic:  Cervical exam deferred        Extremities: Normal range of motion.     Mental Status: Normal mood and affect. Normal behavior. Normal judgment and thought content.   Assessment   31 y.o. G2P1001 at [redacted]w[redacted]d by   12/23/2021, by Ultrasound presenting for routine prenatal visit  Plan   pregnancy2  Problems (from 04/28/21 to present)    Problem Noted Resolved   Obesity affecting pregnancy  by Tresea Mall, CNM No   Supervision of high-risk pregnancy  by Tresea Mall, CNM No   Overview Addendum 11/10/2021  8:18 AM by Mirna Mires, CNM     Nursing Staff Provider  Office Location  [facility] Dating    Language  English Anatomy US    Flu Vaccine   Genetic Screen  NIPS:   TDaP vaccine    Hgb A1C or  GTT Early : Third trimester :   Covid    LAB RESULTS   Rhogam  Rh positive Blood Type     Feeding Plan Breast Antibody    Contraception  Rubella    Circumcision  RPR     Pediatrician   HBsAg     Support Person Derrick HIV    Prenatal Classes  Varicella     GBS  (For PCN allergy, check sensitivities)   BTL Consent     VBAC Consent considering Pap  2021 negative    Hgb Electro    Pelvis Tested  CF      SMA                   Preterm labor symptoms and general obstetric precautions including but not limited to vaginal bleeding, contractions, leaking of fluid and fetal movement were reviewed in detail with the patient. Please refer to After Visit Summary for other counseling recommendations.  Apologies made for scheduling difficulties. Signed consents today for her repeat CS and BTL. She is undecided re breastfeeding. Encouraged hher to take the online class and discuss at  next appt.  Return in about 2 weeks (around 11/24/2021) for return OB, need her other [facility] records recorded.Imagene Riches, CNM  11/10/2021 8:27 AM

## 2021-11-10 NOTE — Progress Notes (Signed)
No vb. No lof.  

## 2021-11-19 ENCOUNTER — Observation Stay
Admission: EM | Admit: 2021-11-19 | Discharge: 2021-11-19 | Disposition: A | Discharge status: Home / Self Care | Payer: Medicaid Other | Attending: Obstetrics | Admitting: Obstetrics

## 2021-11-19 ENCOUNTER — Encounter: Payer: Self-pay | Admitting: Obstetrics

## 2021-11-19 ENCOUNTER — Other Ambulatory Visit: Payer: Self-pay

## 2021-11-19 DIAGNOSIS — O99891 Other specified diseases and conditions complicating pregnancy: Secondary | ICD-10-CM | POA: Diagnosis not present

## 2021-11-19 DIAGNOSIS — N898 Other specified noninflammatory disorders of vagina: Secondary | ICD-10-CM | POA: Diagnosis not present

## 2021-11-19 DIAGNOSIS — O4193X0 Disorder of amniotic fluid and membranes, unspecified, third trimester, not applicable or unspecified: Principal | ICD-10-CM | POA: Insufficient documentation

## 2021-11-19 DIAGNOSIS — Z3A35 35 weeks gestation of pregnancy: Secondary | ICD-10-CM | POA: Insufficient documentation

## 2021-11-19 LAB — RUPTURE OF MEMBRANE (ROM)PLUS: Rom Plus: NEGATIVE

## 2021-11-19 LAB — CHLAMYDIA/NGC RT PCR (ARMC ONLY)
Chlamydia Tr: NOT DETECTED
N gonorrhoeae: NOT DETECTED

## 2021-11-19 LAB — WET PREP, GENITAL
Clue Cells Wet Prep HPF POC: NONE SEEN
Sperm: NONE SEEN
Trich, Wet Prep: NONE SEEN
WBC, Wet Prep HPF POC: <10 (ref <10)
Yeast Wet Prep HPF POC: NONE SEEN

## 2021-11-19 LAB — GROUP B STREP BY PCR: Group B strep by PCR: NEGATIVE

## 2021-11-19 MED ORDER — ACETAMINOPHEN 325 MG PO TABS: 650.0000 mg | ORAL_TABLET | ORAL | Status: DC | PRN

## 2021-11-19 NOTE — Progress Notes (Signed)
Discharge instructions provided to patient. Patient educated on signs and symptoms of labor, vaginal bleeding, LOF, fetal movement, and when to return to the hospital. Patient verbalized understanding and no further questions at this time. Patient discharged home in stable condition.

## 2021-11-19 NOTE — OB Triage Note (Signed)
ROM+ determined that membranes are still intact. Bedside ultrasound preformed by Norberto Sorenson, CNM to confirm vertex position of baby. Patient will receive pending G/C results via MyChart.

## 2021-11-19 NOTE — OB Triage Note (Signed)
Patient is a 31 yo, G2P1, at 35 weeks 1 day. Patient presents with complaints of LOF that started approximately 3 days ago. Patient reports that she has felt trickles of fluid and describes it as clear and states "it smells like semen". Patient denies any fever or chills.   Patient denies any vaginal bleeding. Patient report +FM. Patient denies any consistent contractions and reports occasional BH. Monitors applied and assessing. VSS. Initial fetal heart tone 145. Swanson, CNM notified of patients arrival to unit and presents to bedside for assessment. Plan to place in observation for ROM+ and further monitoring.

## 2021-11-19 NOTE — OB Triage Note (Signed)
LABOR & DELIVERY OB TRIAGE NOTE  SUBJECTIVE  HPI Kelly Baxter is a 31 y.o. G2P1001 at [redacted]w[redacted]d who presents to Labor & Delivery for LOF for the past 3 days. She reports that she first noticed she was leaking a clear fluid that smelled like semen on Tuesday. She did not notice an initial gush but noticed that her underwear kept getting damp. She has been leaking continuously since then. Her most recent intercourse was over a week ago. She denies any bleeding and reports occasional BH ctx. She denies fever, body aches, or recent infection. She endorses good fetal movement. She has h/o cesarean birth for fetal intolerance and plans a repeat cesarean. Her most recent US showed fetus in a breech position.  OB History     Gravida  2   Para  1   Term  1   Preterm      AB      Living  1      SAB      IAB      Ectopic      Multiple  0   Live Births  1           Scheduled Meds: Continuous Infusions: PRN Meds:.acetaminophen  OBJECTIVE BP 121/71 (BP Location: Left Arm)   Pulse (!) 101   Temp 98.1 F (36.7 C) (Oral)   Resp 18   Ht 5\' 4"  (1.626 m)   Wt 122.5 kg   LMP 02/15/2021 (Within Weeks)   BMI 46.35 kg/m   LMP 02/15/2021 (Within Weeks)   Pregnancy dated by [place] at [redacted]w[redacted]d. Exam Cardiac: RRR, no murmur Lungs: CTAB Abdomen: soft, non-tender, gravid Pelvic: external genitalia and vaginal normal in appearance. Perineum is dry. Cervix visually closed. Moderate amount of thin white discharge noted in vaginal vault. No pooling.  BSUS shows vertex  ROM Plus negative Wet prep negative GC/Chlamydia and GBS pending   NST I reviewed the NST and it was reactive.  Baseline: 145 Variability: moderate Accelerations: present Decelerations:none Toco: no contractions Category 1  ASSESSMENT Impression  1) Pregnancy at G2P1001, [redacted]w[redacted]d, Estimated Date of Delivery: 12/23/21 2) Reassuring maternal/fetal status 3) Physiologic discharge of pregnancy  PLAN  1) Discharge  home with standard labor/return precautions. 2) Will follow up on GC/chlamydia outpatient 3) Keep scheduled ROB and [place]  Kelly Baxter, CNM

## 2021-11-22 ENCOUNTER — Encounter: Payer: Self-pay | Admitting: Obstetrics

## 2021-11-25 ENCOUNTER — Encounter: Payer: Self-pay | Admitting: Licensed Practical Nurse

## 2021-11-25 ENCOUNTER — Ambulatory Visit (INDEPENDENT_AMBULATORY_CARE_PROVIDER_SITE_OTHER): Payer: Medicaid Other | Admitting: Licensed Practical Nurse

## 2021-11-25 VITALS — BP 130/83 | HR 97 | Wt 283.0 lb

## 2021-11-25 DIAGNOSIS — E669 Obesity, unspecified: Secondary | ICD-10-CM

## 2021-11-25 DIAGNOSIS — O98313 Other infections with a predominantly sexual mode of transmission complicating pregnancy, third trimester: Secondary | ICD-10-CM

## 2021-11-25 DIAGNOSIS — F419 Anxiety disorder, unspecified: Secondary | ICD-10-CM

## 2021-11-25 DIAGNOSIS — Z3A36 36 weeks gestation of pregnancy: Secondary | ICD-10-CM

## 2021-11-25 DIAGNOSIS — O99343 Other mental disorders complicating pregnancy, third trimester: Secondary | ICD-10-CM

## 2021-11-25 DIAGNOSIS — O99213 Obesity complicating pregnancy, third trimester: Secondary | ICD-10-CM

## 2021-11-25 DIAGNOSIS — O0990 Supervision of high risk pregnancy, unspecified, unspecified trimester: Secondary | ICD-10-CM

## 2021-11-25 DIAGNOSIS — A6000 Herpesviral infection of urogenital system, unspecified: Secondary | ICD-10-CM

## 2021-11-25 LAB — POCT URINALYSIS DIPSTICK
Bilirubin, UA: NEGATIVE
Blood, UA: NEGATIVE
Glucose, UA: NEGATIVE
Ketones, UA: NEGATIVE
Leukocytes, UA: NEGATIVE
Nitrite, UA: NEGATIVE
Protein, UA: NEGATIVE
Spec Grav, UA: 1.015 (ref 1.010–1.025)
Urobilinogen, UA: 0.2 E.U./dL
pH, UA: 5 (ref 5.0–8.0)

## 2021-11-25 NOTE — Progress Notes (Signed)
Routine Prenatal Care Visit  Subjective  Kelly Baxter is a 31 y.o. G2P1001 at [redacted]w[redacted]d being seen today for ongoing prenatal care.  She is currently monitored for the following issues for this high-risk pregnancy and has Adult BMI 40.0-44.9 kg/sq m ([place]); Genital herpes simplex; Anxiety, generalized; Obesity affecting pregnancy; Supervision of high-risk pregnancy; and Labor and delivery, indication for care on their problem list.  ----------------------------------------------------------------------------------- Patient reports  sciatic pain and insomnia .  Seeing chiro. Feels ready to have baby, desires to schedule RLTCS with BTL  Contractions: Not present. Vag. Bleeding: None.  Movement: Present. Leaking Fluid denies.  ----------------------------------------------------------------------------------- The following portions of the patient's history were reviewed and updated as appropriate: allergies, current medications, past family history, past medical history, past social history, past surgical history and problem list. Problem list updated.  Objective  Blood pressure 130/83, pulse 97, weight 283 lb (128.4 kg), last menstrual period 02/15/2021, unknown if currently breastfeeding. Pregravid weight 256 lb (116.1 kg) Total Weight Gain 27 lb (12.2 kg) Urinalysis: Urine Protein    Urine Glucose    Fetal Status: Fetal Heart Rate (bpm): 155 Fundal Height: 37 cm Movement: Present      General:  Alert, oriented and cooperative. Patient is in no acute distress.  Skin: Skin is warm and dry. No rash noted.   Cardiovascular: Normal heart rate noted  Respiratory: Normal respiratory effort, no problems with respiration noted  Abdomen: Soft, gravid, appropriate for gestational age. Pain/Pressure: Present     Pelvic:  Cervical exam deferred        Extremities: Normal range of motion.  Edema: Trace  Mental Status: Normal mood and affect. Normal behavior. Normal judgment and thought content.    Assessment   31 y.o. G2P1001 at [redacted]w[redacted]d by  12/23/2021, by Ultrasound presenting for routine prenatal visit  Plan   pregnancy2  Problems (from 04/28/21 to present)     Problem Noted Resolved   Obesity affecting pregnancy  by Rod Can, CNM No   Supervision of high-risk pregnancy  by Rod Can, CNM No   Overview Addendum 11/10/2021  8:18 AM by Imagene Riches, CNM     Nursing Staff Provider  Office Location  [facility] Dating    Language  English Anatomy US    Flu Vaccine   Genetic Screen  NIPS:   TDaP vaccine    Hgb A1C or  GTT Early : Third trimester :   Covid    LAB RESULTS   Rhogam  Rh positive Blood Type     Feeding Plan Breast Antibody    Contraception  Rubella    Circumcision  RPR     Pediatrician   HBsAg     Support Person Derrick HIV    Prenatal Classes  Varicella     GBS  (For PCN allergy, check sensitivities)   BTL Consent     VBAC Consent considering Pap  2021 negative    Hgb Electro    Pelvis Tested  CF      SMA                    Preterm labor symptoms and general obstetric precautions including but not limited to vaginal bleeding, contractions, leaking of fluid and fetal movement were reviewed in detail with the patient. Please refer to After Visit Summary for other counseling recommendations.   Return in about 1 week (around 12/02/2021) for [place] with DR Marcelline Mates or Amalia Hailey .  RLTCS with BTL scheduled for Oct 27,  pt will be contacted with arrival time etc.   Roberto Scales, [place], [facility]  11/25/21  2:28 PM

## 2021-11-26 ENCOUNTER — Ambulatory Visit
Admission: RE | Admit: 2021-11-26 | Discharge: 2021-11-26 | Disposition: A | Discharge status: Home / Self Care | Payer: Medicaid Other | Source: Ambulatory Visit | Attending: Obstetrics | Admitting: Obstetrics

## 2021-11-26 DIAGNOSIS — O0990 Supervision of high risk pregnancy, unspecified, unspecified trimester: Secondary | ICD-10-CM | POA: Diagnosis present

## 2021-11-26 DIAGNOSIS — O99211 Obesity complicating pregnancy, first trimester: Secondary | ICD-10-CM | POA: Diagnosis present

## 2021-12-01 ENCOUNTER — Ambulatory Visit (INDEPENDENT_AMBULATORY_CARE_PROVIDER_SITE_OTHER): Payer: Medicaid Other | Admitting: Obstetrics and Gynecology

## 2021-12-01 ENCOUNTER — Encounter: Payer: Self-pay | Admitting: Obstetrics and Gynecology

## 2021-12-01 VITALS — BP 109/74 | HR 76 | Wt 286.5 lb

## 2021-12-01 DIAGNOSIS — Z3A36 36 weeks gestation of pregnancy: Secondary | ICD-10-CM

## 2021-12-01 DIAGNOSIS — O0990 Supervision of high risk pregnancy, unspecified, unspecified trimester: Secondary | ICD-10-CM

## 2021-12-01 DIAGNOSIS — O0993 Supervision of high risk pregnancy, unspecified, third trimester: Secondary | ICD-10-CM

## 2021-12-01 LAB — POCT URINALYSIS DIPSTICK OB
Bilirubin, UA: NEGATIVE
Blood, UA: NEGATIVE
Glucose, UA: NEGATIVE
Ketones, UA: NEGATIVE
Leukocytes, UA: NEGATIVE
Nitrite, UA: NEGATIVE
Spec Grav, UA: 1.015 (ref 1.010–1.025)
Urobilinogen, UA: 0.2 E.U./dL
pH, UA: 6 (ref 5.0–8.0)

## 2021-12-01 NOTE — Progress Notes (Signed)
ROB: Patient has confirmed her desire for permanent sterilization.LARC methods discussed patient declined.  Patient desires bilateral salpingectomy.  Anesthesia consult ordered and scheduled.  CD for 10/27 at 730.  Patient currently taking aspirin as directed.

## 2021-12-01 NOTE — Progress Notes (Signed)
ROB. Patient states daily fetal movement with increased lower pelvic pressure. She states no questions or concerns at this time.

## 2021-12-08 ENCOUNTER — Encounter
Admission: RE | Admit: 2021-12-08 | Discharge: 2021-12-08 | Disposition: A | Discharge status: Home / Self Care | Payer: Medicaid Other | Source: Ambulatory Visit | Attending: Anesthesiology | Admitting: Anesthesiology

## 2021-12-08 ENCOUNTER — Ambulatory Visit (INDEPENDENT_AMBULATORY_CARE_PROVIDER_SITE_OTHER): Payer: Medicaid Other | Admitting: Certified Nurse Midwife

## 2021-12-08 VITALS — BP 114/74 | HR 90 | Wt 291.4 lb

## 2021-12-08 DIAGNOSIS — Z3A37 37 weeks gestation of pregnancy: Secondary | ICD-10-CM

## 2021-12-08 DIAGNOSIS — Z3483 Encounter for supervision of other normal pregnancy, third trimester: Secondary | ICD-10-CM

## 2021-12-08 NOTE — Progress Notes (Signed)
ROB doing well, feeling good fetal movement.Denies contractions.  She is very uncomfortable due to not sleeping well and normal pregnancy pains but other wise, she is well. She has her c/s scheduled 10/27. She will follow up with Dr. Amalia Hailey next week for pre op appointment.   Philip Aspen, CNM

## 2021-12-08 NOTE — Consult Note (Signed)
Anesthesiology consult note ( Ambulatory referral to OB anesthesia for morbid obesity) :  I had the distinct pleasure of meeting Kelly Baxter  today for an OB anesthesia precheck.  She has a history of morbid obesity with a BMI today of 49.  Her EDD is 10/27 via repeat cesarean section with BTL.  Patient reports no previous problems with anesthesia, no problems with her back and has a MP score of 1.  We discussed the risks of Obesity in Pregnancy including but not limited to: Patients with a BMI > 40 are at increased risk for complications during pregnancy, such as hypertensive disorders of pregnancy, gestational diabetes, obstructive sleep apnea, thromboembolic disease, prolonged labor, operative vaginal delivery and need for cesarean delivery. There is an increased risk of airway complications, including inability to place a breathing tube, should an emergency cesarean delivery be required. Epidural placement is more difficult in obesity, often requires multiple attempts, more often results in inadequate pain relief, more often requires replacement due to movement of the epidural catheter and more often results in accidental dural puncture and post dural puncture headache. Patient voiced understanding and did not have any questions. We are prepared for her scheduled CD 10/17.  Daron Offer  MD Anesthesia

## 2021-12-08 NOTE — Patient Instructions (Signed)
Cesarean Delivery Cesarean birth, or cesarean delivery, is the surgical delivery of a baby through an incision in the abdomen and the uterus. This may be referred to as a C-section. This procedure may be scheduled ahead of time, or it may be done in an emergency situation. Tell a health care provider about: Any allergies you have. All medicines you are taking, including vitamins, herbs, eye drops, creams, and over-the-counter medicines. Any problems you or family members have had with anesthetic medicines. Any bleeding problems you have. Any surgeries you have had. Any medical conditions you have. Whether you or any members of your family have a history of deep vein thrombosis (DVT) or pulmonary embolism (PE). What are the risks? Generally, this is a safe procedure. However, problems may occur, including: Infection. Bleeding. Allergic reactions to medicines. Damage to other structures or organs. Blood clots. Injury to your baby. What happens before the procedure? Medicines Ask your health care provider about: Changing or stopping your regular medicines. This is especially important if you are taking diabetes medicines or blood thinners. Taking medicines such as aspirin and ibuprofen. These medicines can thin your blood. Do not take these medicines unless your health care provider tells you to take them. Taking over-the-counter medicines, vitamins, herbs, and supplements. Tests Depending on the reason for your cesarean delivery, you may have a physical exam or additional testing, such as an ultrasound. You may have your blood or urine tested. General instructions Follow instructions from your health care provider about eating or drinking restrictions. Do not shave your pubic area if you know that you are going to have a cesarean delivery. Shaving before the procedure may increase your risk of infection. Plan to have someone take you home from the hospital. Ask your health care provider  what steps will be taken to prevent infection. These may include: Washing skin with a germ-killing soap. Taking antibiotic medicine. Questions for your health care provider Ask your health care provider about: Your pain management plan. This is especially important if you plan to breastfeed your baby. How long you will be in the hospital after the procedure. Any concerns you may have about receiving blood products, if you need them during the procedure. Cord blood banking, if you plan to collect your baby's umbilical cord blood. You may also want to ask your health care provider: Whether you will be able to hold or breastfeed your baby while you are still in the operating room. Whether your baby can stay with you immediately after the procedure and during your recovery. Whether a family member or a person of your choice can go with you into the operating room and stay with you during the procedure, immediately after the procedure, and during your recovery. What happens during the procedure?  An IV will be inserted into one of your veins. Fluid and medicines, such as antibiotics, will be given before the surgery. Fetal monitors will be placed on your abdomen to check your baby's heart rate. You may be given a warming gown to wear to keep your temperature stable. A catheter may be inserted into your bladder through your urethra. This drains your urine during the procedure. You may be given one or more of the following: A medicine to numb the area (local anesthetic). A medicine to make you fall asleep (general anesthetic). A medicine (regional anesthetic) that is injected into your back or through a small thin tube placed in your back (spinal anesthetic or epidural anesthetic). This numbs everything below the  injection site and allows you to stay awake during your procedure. If this makes you feel nauseous, tell your health care provider. Medicines will be available to help reduce any nausea you  may feel. An incision will be made in your abdomen, and then in your uterus. If you are awake during your procedure, you may feel tugging and pulling in your abdomen, but you should not feel pain. If you feel pain, tell your health care provider immediately. Your baby will be removed from your uterus. You may feel more pressure or pushing while this happens. Immediately after birth, your baby will be dried and kept warm. You may be able to hold and breastfeed your baby. The umbilical cord may be clamped and cut during this time. This usually occurs after waiting a period of 1-2 minutes after delivery. Your placenta will be removed from your uterus. Your incisions will be closed with stitches (sutures). Staples, skin glue, or adhesive strips may also be applied to the incision in your abdomen. Bandages (dressings) may be placed over the incision in your abdomen. The procedure may vary among health care providers and hospitals. What happens after the procedure? Your blood pressure, heart rate, breathing rate, and blood oxygen level will be monitored until you leave the hospital or clinic. You may continue to receive fluids and medicines through an IV. You will have some pain. Medicines will be available to help control your pain. To help prevent blood clots: You may be given medicines. You may have to wear compression stockings or devices. You will be encouraged to walk around when you are able. Hospital staff will encourage and support bonding with your baby. Your hospital may have you and your baby stay in the same room (rooming in) during your hospital stay to encourage successful bonding and breastfeeding. You may be encouraged to cough and breathe deeply often. This helps to prevent lung problems. If you have a catheter draining your urine, it will be removed as soon as possible after your procedure. Summary Cesarean birth, or cesarean delivery, is the surgical delivery of a baby through an  incision in the abdomen and the uterus. Follow instructions from your health care provider about eating or drinking restrictions before the procedure. You will have some pain after the procedure. Medicines will be available to help control your pain. Hospital staff will encourage and support bonding with your baby after the procedure. Your hospital may have you and your baby stay in the same room (rooming in) during your hospital stay to encourage successful bonding and breastfeeding. This information is not intended to replace advice given to you by your health care provider. Make sure you discuss any questions you have with your health care provider. Document Revised: 09/10/2020 Document Reviewed: 09/10/2020 Elsevier Patient Education  2023 [employer]  

## 2021-12-12 NOTE — Addendum Note (Signed)
Addended by: Finis Bud on: 12/12/2021 10:57 AM   Modules accepted: Orders

## 2021-12-14 ENCOUNTER — Encounter: Payer: Self-pay | Admitting: Urgent Care

## 2021-12-14 ENCOUNTER — Encounter
Admission: RE | Admit: 2021-12-14 | Discharge: 2021-12-14 | Disposition: A | Discharge status: Home / Self Care | Payer: Medicaid Other | Source: Ambulatory Visit | Attending: Obstetrics and Gynecology | Admitting: Obstetrics and Gynecology

## 2021-12-14 ENCOUNTER — Other Ambulatory Visit: Payer: Self-pay

## 2021-12-14 DIAGNOSIS — Z3A36 36 weeks gestation of pregnancy: Secondary | ICD-10-CM

## 2021-12-14 DIAGNOSIS — Z01818 Encounter for other preprocedural examination: Secondary | ICD-10-CM | POA: Insufficient documentation

## 2021-12-14 LAB — TYPE AND SCREEN
ABO/RH(D): O POS
Antibody Screen: NEGATIVE
Extend sample reason: UNDETERMINED

## 2021-12-14 LAB — CBC
HCT: 33.6 % — ABNORMAL LOW (ref 36.0–46.0)
Hemoglobin: 10.8 g/dL — ABNORMAL LOW (ref 12.0–15.0)
MCH: 26.2 pg (ref 26.0–34.0)
MCHC: 32.1 g/dL (ref 30.0–36.0)
MCV: 81.6 fL (ref 80.0–100.0)
Platelets: 247 10*3/uL (ref 150–400)
RBC: 4.12 MIL/uL (ref 3.87–5.11)
RDW: 14.8 % (ref 11.5–15.5)
WBC: 11.6 10*3/uL — ABNORMAL HIGH (ref 4.0–10.5)
nRBC: 0 % (ref 0.0–0.2)

## 2021-12-14 NOTE — Patient Instructions (Addendum)
Your procedure is scheduled on:12/18/2021  Report to the Emergency department at 5:30 am two hours before OR time. Check in at the ED desk and they will give you instructions on where to go.   REMEMBER: Instructions that are not followed completely may result in serious medical risk, up to and including death; or upon the discretion of your surgeon and anesthesiologist your surgery may need to be rescheduled.  Do not eat food after midnight the night before surgery.  No gum chewing, lozengers or hard candies.   TAKE THESE MEDICATIONS THE MORNING OF SURGERY WITH A SIP OF WATER:   1.Omeprazole -one the night before surgery and one in am of surgery 2.  pre-natal    Follow recommendations from Cardiologist, Pulmonologist or PCP or the Prescribing Physician for the  Aspirin.-This is very important!  One week prior to surgery: Stop Anti-inflammatories (NSAIDS) such as Advil, Aleve, Ibuprofen, Motrin, Naproxen, Naprosyn and Aspirin based products such as Excedrin, Goodys Powder, BC Powder. Stop ANY OVER THE COUNTER supplements until after surgery. You may however, continue to take Tylenol if needed for pain up until the day of surgery.  No Alcohol for 24 hours before or after surgery.  No Smoking including e-cigarettes for 24 hours prior to surgery.  No chewable tobacco products for at least 6 hours prior to surgery.  No nicotine patches on the day of surgery.  Do not use any "recreational" drugs for at least a week prior to your surgery.  Please be advised that the combination of cocaine and anesthesia may have negative outcomes, up to and including death. If you test positive for cocaine, your surgery will be cancelled.  On the morning of surgery brush your teeth with toothpaste and water, you may rinse your mouth with mouthwash if you wish. Do not swallow any toothpaste or mouthwash.  Use CHG Soap as directed on instruction sheet.-provided for you . This is to cleanse your skin.      Do not wear jewelry, make-up, hairpins, clips or nail polish.  Do not wear lotions, powders, or perfumes.   Do not shave body from the neck down 48 hours prior to surgery just in case you cut yourself which could leave a site for infection.  Also, freshly shaved skin may become irritated if using the CHG soap.  Contact lenses, hearing aids and dentures may not be worn into surgery.  Do not bring valuables to the hospital. [facility] is not responsible for any missing/lost belongings or valuables.   Notify your doctor if there is any change in your medical condition (cold, fever, infection).  Wear comfortable clothing (specific to your surgery type) to the hospital.  After surgery, you can help prevent lung complications by doing breathing exercises.  Take deep breaths and cough every 1-2 hours. Your doctor may order a device called an Incentive Spirometer to help you take deep breaths.   If you are being admitted to the hospital overnight, leave your suitcase in the car. After surgery it may be brought to your room.  If you are being discharged the day of surgery, you will not be allowed to drive home. You will need a responsible adult (18 years or older) to drive you home and stay with you that night.   If you are taking public transportation, you will need to have a responsible adult (18 years or older) with you. Please confirm with your physician that it is acceptable to use public transportation.   Please call  the Pre-admissions Testing Dept. at [phone] if you have any questions about these instructions.  Surgery Visitation Policy:  Patients undergoing a surgery or procedure may have two family members or support persons with them as long as the person is not COVID-19 positive or experiencing its symptoms.   Inpatient Visitation:    Visiting hours are 7 a.m. to 8 p.m. Up to four visitors are allowed at one time in a patient room, including children. The visitors  may rotate out with other people during the day. One designated support person (adult) may remain overnight.

## 2021-12-15 LAB — RPR: RPR Ser Ql: NONREACTIVE

## 2021-12-16 ENCOUNTER — Encounter: Payer: Self-pay | Admitting: Obstetrics and Gynecology

## 2021-12-16 ENCOUNTER — Ambulatory Visit (INDEPENDENT_AMBULATORY_CARE_PROVIDER_SITE_OTHER): Payer: Medicaid Other | Admitting: Obstetrics and Gynecology

## 2021-12-16 VITALS — BP 120/80 | HR 76 | Wt 295.7 lb

## 2021-12-16 DIAGNOSIS — O0993 Supervision of high risk pregnancy, unspecified, third trimester: Secondary | ICD-10-CM

## 2021-12-16 DIAGNOSIS — Z3A39 39 weeks gestation of pregnancy: Secondary | ICD-10-CM

## 2021-12-16 LAB — POCT URINALYSIS DIPSTICK OB
Bilirubin, UA: NEGATIVE
Blood, UA: NEGATIVE
Glucose, UA: NEGATIVE
Ketones, UA: NEGATIVE
Leukocytes, UA: NEGATIVE
Nitrite, UA: NEGATIVE
POC,PROTEIN,UA: NEGATIVE
Spec Grav, UA: 1.015 (ref 1.010–1.025)
Urobilinogen, UA: 0.2 E.U./dL
pH, UA: 6 (ref 5.0–8.0)

## 2021-12-16 NOTE — Progress Notes (Signed)
ROB: Doing well.  Has occasional contractions but nothing regular.  Reports daily fetal movement.  States that she is excited and ready for cesarean delivery on Friday.  She has again affirmed her desire for permanent sterilization and she wants the entire tube removed.

## 2021-12-16 NOTE — Progress Notes (Signed)
ROB. Patient states daily fetal movement with increased pressure. She states questions related to ASA and when to stop prior to C-section. Patient states no questions or concerns at this time.

## 2021-12-18 ENCOUNTER — Other Ambulatory Visit: Payer: Self-pay

## 2021-12-18 ENCOUNTER — Encounter: Payer: Self-pay | Admitting: Obstetrics and Gynecology

## 2021-12-18 ENCOUNTER — Inpatient Hospital Stay: Payer: Medicaid Other | Admitting: Urgent Care

## 2021-12-18 ENCOUNTER — Inpatient Hospital Stay
Admission: RE | Admit: 2021-12-18 | Discharge: 2021-12-19 | DRG: 785 | Disposition: A | Discharge status: Home / Self Care | Payer: Medicaid Other | Attending: Obstetrics and Gynecology | Admitting: Obstetrics and Gynecology

## 2021-12-18 ENCOUNTER — Inpatient Hospital Stay: Payer: Medicaid Other | Admitting: Anesthesiology

## 2021-12-18 ENCOUNTER — Encounter
Admission: RE | Disposition: A | Discharge status: Home / Self Care | Payer: Self-pay | Source: Home / Self Care | Attending: Obstetrics and Gynecology

## 2021-12-18 DIAGNOSIS — Z302 Encounter for sterilization: Secondary | ICD-10-CM

## 2021-12-18 DIAGNOSIS — Z3490 Encounter for supervision of normal pregnancy, unspecified, unspecified trimester: Secondary | ICD-10-CM

## 2021-12-18 DIAGNOSIS — O34211 Maternal care for low transverse scar from previous cesarean delivery: Secondary | ICD-10-CM | POA: Diagnosis present

## 2021-12-18 DIAGNOSIS — Z3A39 39 weeks gestation of pregnancy: Secondary | ICD-10-CM

## 2021-12-18 DIAGNOSIS — O99214 Obesity complicating childbirth: Secondary | ICD-10-CM | POA: Diagnosis present

## 2021-12-18 DIAGNOSIS — Z7982 Long term (current) use of aspirin: Secondary | ICD-10-CM

## 2021-12-18 DIAGNOSIS — O0990 Supervision of high risk pregnancy, unspecified, unspecified trimester: Principal | ICD-10-CM

## 2021-12-18 LAB — CBC
HCT: 34.1 % — ABNORMAL LOW (ref 36.0–46.0)
Hemoglobin: 10.9 g/dL — ABNORMAL LOW (ref 12.0–15.0)
MCH: 25.7 pg — ABNORMAL LOW (ref 26.0–34.0)
MCHC: 32 g/dL (ref 30.0–36.0)
MCV: 80.4 fL (ref 80.0–100.0)
Platelets: 256 10*3/uL (ref 150–400)
RBC: 4.24 MIL/uL (ref 3.87–5.11)
RDW: 15 % (ref 11.5–15.5)
WBC: 12.5 10*3/uL — ABNORMAL HIGH (ref 4.0–10.5)
nRBC: 0 % (ref 0.0–0.2)

## 2021-12-18 LAB — TYPE AND SCREEN
ABO/RH(D): O POS
Antibody Screen: NEGATIVE

## 2021-12-18 LAB — RPR: RPR Ser Ql: NONREACTIVE

## 2021-12-18 SURGERY — Surgical Case
Anesthesia: Spinal

## 2021-12-18 MED ORDER — MEPERIDINE HCL 25 MG/ML IJ SOLN: 6.2500 mg | INTRAMUSCULAR | Status: DC | PRN

## 2021-12-18 MED ORDER — FENTANYL CITRATE (PF) 100 MCG/2ML IJ SOLN
INTRAMUSCULAR | Status: AC
  Filled 2021-12-18: qty 2

## 2021-12-18 MED ORDER — BUPIVACAINE IN DEXTROSE 0.75-8.25 % IT SOLN
INTRATHECAL | Status: DC | PRN
  Administered 2021-12-18: 1.6 mL via INTRATHECAL

## 2021-12-18 MED ORDER — OXYCODONE-ACETAMINOPHEN 5-325 MG PO TABS: 1.0000 | ORAL_TABLET | ORAL | Status: DC | PRN

## 2021-12-18 MED ORDER — ONDANSETRON HCL 4 MG/2ML IJ SOLN
INTRAMUSCULAR | Status: DC | PRN
  Administered 2021-12-18: 4 mg via INTRAVENOUS

## 2021-12-18 MED ORDER — KETOROLAC TROMETHAMINE 30 MG/ML IJ SOLN: 30.0000 mg | Freq: Four times a day (QID) | INTRAMUSCULAR | Status: AC | PRN

## 2021-12-18 MED ORDER — IBUPROFEN 600 MG PO TABS
600.0000 mg | ORAL_TABLET | Freq: Four times a day (QID) | ORAL | Status: DC
  Administered 2021-12-18 – 2021-12-19 (×5): 600 mg via ORAL
  Filled 2021-12-18 (×5): qty 1

## 2021-12-18 MED ORDER — FENTANYL CITRATE (PF) 100 MCG/2ML IJ SOLN
INTRAMUSCULAR | Status: DC | PRN
  Administered 2021-12-18: 15 ug via INTRATHECAL

## 2021-12-18 MED ORDER — SENNOSIDES-DOCUSATE SODIUM 8.6-50 MG PO TABS
2.0000 | ORAL_TABLET | ORAL | Status: DC
  Administered 2021-12-18 – 2021-12-19 (×2): 2 via ORAL
  Filled 2021-12-18 (×2): qty 2

## 2021-12-18 MED ORDER — CHLORHEXIDINE GLUCONATE 0.12 % MT SOLN
15.0000 mL | Freq: Once | OROMUCOSAL | Status: AC
  Administered 2021-12-18: 15 mL via OROMUCOSAL
  Filled 2021-12-18: qty 15

## 2021-12-18 MED ORDER — LIDOCAINE HCL (PF) 1 % IJ SOLN
INTRAMUSCULAR | Status: DC | PRN
  Administered 2021-12-18: 3 mL via SUBCUTANEOUS

## 2021-12-18 MED ORDER — LIDOCAINE 5 % EX PTCH
MEDICATED_PATCH | CUTANEOUS | Status: DC | PRN
  Administered 2021-12-18: 1 via TRANSDERMAL

## 2021-12-18 MED ORDER — ORAL CARE MOUTH RINSE: 15.0000 mL | Freq: Once | OROMUCOSAL | Status: AC

## 2021-12-18 MED ORDER — LACTATED RINGERS IV SOLN: INTRAVENOUS | Status: DC

## 2021-12-18 MED ORDER — PROMETHAZINE HCL 25 MG/ML IJ SOLN: 6.2500 mg | INTRAMUSCULAR | Status: DC | PRN

## 2021-12-18 MED ORDER — NALOXONE HCL 0.4 MG/ML IJ SOLN: 0.4000 mg | INTRAMUSCULAR | Status: DC | PRN

## 2021-12-18 MED ORDER — OXYTOCIN-SODIUM CHLORIDE 30-0.9 UT/500ML-% IV SOLN
2.5000 [IU]/h | INTRAVENOUS | Status: AC
  Administered 2021-12-18: 2.5 [IU]/h via INTRAVENOUS

## 2021-12-18 MED ORDER — OXYTOCIN-SODIUM CHLORIDE 30-0.9 UT/500ML-% IV SOLN
INTRAVENOUS | Status: DC | PRN
  Administered 2021-12-18: 30 [IU] via INTRAVENOUS

## 2021-12-18 MED ORDER — DIPHENHYDRAMINE HCL 50 MG/ML IJ SOLN: 12.5000 mg | INTRAMUSCULAR | Status: DC | PRN

## 2021-12-18 MED ORDER — NALOXONE HCL 4 MG/10ML IJ SOLN: 1.0000 ug/kg/h | INTRAVENOUS | Status: DC | PRN

## 2021-12-18 MED ORDER — SIMETHICONE 80 MG PO CHEW
80.0000 mg | CHEWABLE_TABLET | Freq: Four times a day (QID) | ORAL | Status: DC
  Administered 2021-12-18 – 2021-12-19 (×4): 80 mg via ORAL
  Filled 2021-12-18 (×4): qty 1

## 2021-12-18 MED ORDER — DIPHENHYDRAMINE HCL 25 MG PO CAPS: 25.0000 mg | ORAL_CAPSULE | ORAL | Status: DC | PRN

## 2021-12-18 MED ORDER — POVIDONE-IODINE 10 % EX SWAB
2.0000 | Freq: Once | CUTANEOUS | Status: AC
  Administered 2021-12-18: 2 via TOPICAL

## 2021-12-18 MED ORDER — PHENYLEPHRINE HCL-NACL 20-0.9 MG/250ML-% IV SOLN
INTRAVENOUS | Status: AC
  Filled 2021-12-18: qty 250

## 2021-12-18 MED ORDER — ZOLPIDEM TARTRATE 5 MG PO TABS: 5.0000 mg | ORAL_TABLET | Freq: Every evening | ORAL | Status: DC | PRN

## 2021-12-18 MED ORDER — OXYCODONE HCL 5 MG PO TABS: 5.0000 mg | ORAL_TABLET | Freq: Four times a day (QID) | ORAL | Status: DC | PRN

## 2021-12-18 MED ORDER — ONDANSETRON HCL 4 MG/2ML IJ SOLN: 4.0000 mg | Freq: Three times a day (TID) | INTRAMUSCULAR | Status: DC | PRN

## 2021-12-18 MED ORDER — LIDOCAINE 5 % EX PTCH
MEDICATED_PATCH | CUTANEOUS | Status: AC
  Filled 2021-12-18: qty 1

## 2021-12-18 MED ORDER — MORPHINE SULFATE (PF) 0.5 MG/ML IJ SOLN
INTRAMUSCULAR | Status: DC | PRN
  Administered 2021-12-18: .1 mg via INTRATHECAL

## 2021-12-18 MED ORDER — MENTHOL 3 MG MT LOZG: 1.0000 | LOZENGE | OROMUCOSAL | Status: DC | PRN

## 2021-12-18 MED ORDER — OXYTOCIN-SODIUM CHLORIDE 30-0.9 UT/500ML-% IV SOLN
INTRAVENOUS | Status: AC
  Filled 2021-12-18: qty 500

## 2021-12-18 MED ORDER — CEFAZOLIN IN SODIUM CHLORIDE 3-0.9 GM/100ML-% IV SOLN
3.0000 g | INTRAVENOUS | Status: AC
  Administered 2021-12-18: 3 g via INTRAVENOUS
  Filled 2021-12-18: qty 100

## 2021-12-18 MED ORDER — SODIUM CHLORIDE 0.9% FLUSH: 3.0000 mL | INTRAVENOUS | Status: DC | PRN

## 2021-12-18 MED ORDER — PRENATAL MULTIVITAMIN CH
1.0000 | ORAL_TABLET | Freq: Every day | ORAL | Status: DC
  Administered 2021-12-18 – 2021-12-19 (×2): 1 via ORAL
  Filled 2021-12-18 (×2): qty 1

## 2021-12-18 MED ORDER — KETOROLAC TROMETHAMINE 30 MG/ML IJ SOLN
INTRAMUSCULAR | Status: AC
  Filled 2021-12-18: qty 2

## 2021-12-18 MED ORDER — MORPHINE SULFATE (PF) 0.5 MG/ML IJ SOLN
INTRAMUSCULAR | Status: AC
  Filled 2021-12-18: qty 10

## 2021-12-18 MED ORDER — FENTANYL CITRATE (PF) 100 MCG/2ML IJ SOLN: 25.0000 ug | INTRAMUSCULAR | Status: DC | PRN

## 2021-12-18 MED ORDER — ONDANSETRON HCL 4 MG/2ML IJ SOLN
INTRAMUSCULAR | Status: AC
  Filled 2021-12-18: qty 2

## 2021-12-18 MED ORDER — DIPHENHYDRAMINE HCL 25 MG PO CAPS: 25.0000 mg | ORAL_CAPSULE | Freq: Four times a day (QID) | ORAL | Status: DC | PRN

## 2021-12-18 MED ORDER — PHENYLEPHRINE HCL-NACL 20-0.9 MG/250ML-% IV SOLN
INTRAVENOUS | Status: DC | PRN
  Administered 2021-12-18: 40 ug/min via INTRAVENOUS

## 2021-12-18 MED ORDER — ACETAMINOPHEN 500 MG PO TABS
1000.0000 mg | ORAL_TABLET | Freq: Four times a day (QID) | ORAL | Status: DC | PRN
  Administered 2021-12-18 – 2021-12-19 (×3): 1000 mg via ORAL
  Filled 2021-12-18 (×3): qty 2

## 2021-12-18 MED ORDER — KETOROLAC TROMETHAMINE 30 MG/ML IJ SOLN
INTRAMUSCULAR | Status: DC | PRN
  Administered 2021-12-18: 30 mg via INTRAVENOUS

## 2021-12-18 MED ORDER — POVIDONE-IODINE 10 % EX SWAB: 2.0000 | Freq: Once | CUTANEOUS | Status: DC

## 2021-12-18 SURGICAL SUPPLY — 29 items
ADHESIVE MASTISOL STRL (MISCELLANEOUS) ×1 IMPLANT
BAG COUNTER SPONGE SURGICOUNT (BAG) ×1 IMPLANT
CHLORAPREP W/TINT 26 (MISCELLANEOUS) ×2 IMPLANT
CLIP FILSHIE TUBAL LIGA STRL (Clip) IMPLANT
CLOSURE STERI STRIP 1/2 X4 (GAUZE/BANDAGES/DRESSINGS) IMPLANT
DRSG TELFA 3X8 NADH STRL (GAUZE/BANDAGES/DRESSINGS) ×1 IMPLANT
GAUZE SPONGE 4X4 12PLY STRL (GAUZE/BANDAGES/DRESSINGS) ×1 IMPLANT
GLOVE PI ORTHO PRO STRL 7.5 (GLOVE) ×1 IMPLANT
GOWN STRL REUS W/ TWL LRG LVL3 (GOWN DISPOSABLE) ×2 IMPLANT
GOWN STRL REUS W/TWL LRG LVL3 (GOWN DISPOSABLE) ×2
KIT TURNOVER KIT A (KITS) ×1 IMPLANT
MANIFOLD NEPTUNE II (INSTRUMENTS) ×1 IMPLANT
MAT PREVALON FULL STRYKER (MISCELLANEOUS) ×1 IMPLANT
NS IRRIG 1000ML POUR BTL (IV SOLUTION) ×1 IMPLANT
PACK C SECTION AR (MISCELLANEOUS) ×1 IMPLANT
PAD OB MATERNITY 4.3X12.25 (PERSONAL CARE ITEMS) ×1 IMPLANT
PAD PREP 24X41 OB/GYN DISP (PERSONAL CARE ITEMS) ×1 IMPLANT
RETRACTOR WND ALEXIS-O 25 LRG (MISCELLANEOUS) ×1 IMPLANT
RTRCTR WOUND ALEXIS O 25CM LRG (MISCELLANEOUS) ×1
SCRUB CHG 4% DYNA-HEX 4OZ (MISCELLANEOUS) ×1 IMPLANT
SPONGE T-LAP 18X18 ~~LOC~~+RFID (SPONGE) ×1 IMPLANT
SUT VIC AB 0 CTX 36 (SUTURE) ×2
SUT VIC AB 0 CTX36XBRD ANBCTRL (SUTURE) ×2 IMPLANT
SUT VIC AB 1 CT1 36 (SUTURE) ×2 IMPLANT
SUT VICRYL 3-0 27IN SH (SUTURE) IMPLANT
SUT VICRYL+ 3-0 36IN CT-1 (SUTURE) ×2 IMPLANT
TAPE TRANSPORE STRL 2 31045 (GAUZE/BANDAGES/DRESSINGS) IMPLANT
TRAP FLUID SMOKE EVACUATOR (MISCELLANEOUS) ×1 IMPLANT
WATER STERILE IRR 500ML POUR (IV SOLUTION) ×1 IMPLANT

## 2021-12-18 NOTE — Op Note (Signed)
     OP NOTE  Date: 12/18/2021   10:22 AM Name Kelly Baxter MR# [id number]  Preoperative Diagnosis: 1. Intrauterine pregnancy at [redacted]w[redacted]d 2. Desires Permanent Sterilization Principal Problem:   Term pregnancy, repeat  Postoperative Diagnosis: 1. Intrauterine pregnancy at [redacted]w[redacted]d, delivered 2. Desires Permanent Sterilization 3. Viable infant 4. Remainder same as pre-op  Procedure: 1.  Repeat Low-Transverse Cesarean Section 2. Bilateral salpingectomy  Surgeon: Finis Bud, MD  Assistant: Norberto Sorenson, CNM   No other capable assistant was available for this surgery which requires an experienced, high level assistant.   Anesthesia: Spinal   EBL: 0  ml    Findings: 1) female infant, Apgar scores of 8   at 1 minute and 9   at 5 minutes and a birthweight of 134.75  ounces.    2) Normal uterus, tubes and ovaries.   Procedure:   The patient was prepped and draped in the supine position and placed under spinal anesthesia.  A transverse incision was made across the abdomen in a Pfannenstiel manner. If indicated the old scar was systematically removed with sharp dissection.  We carried the dissection down to the level of the fascia.  The fascia was incised in a curvilinear manner.  The fascia was then elevated from the rectus muscles with blunt and sharp dissection.  The rectus muscles were separated laterally exposing the peritoneum.  The peritoneum was carefully entered with care being taken to avoid bowel and bladder.  A self-retaining retractor was placed.  The visceral peritoneum was incised in a curvilinear fashion across the lower uterine segment creating a bladder flap. A transverse incision was made across the lower uterine segment and extended laterally and superiorly using the bandage scissors.  Artificial rupture membranes was performed and Clear fluid was noted.  The infant was delivered from the cephalic position.  A nuchal cord was present. The cord was doubly clamped and cut.  Cord blood was obtained if appropriate.  The infant was handed to the pediatric personnel  who then placed the infant under heat lamps where it was cleaned dried and re-suctioned. The placenta was delivered. The hysterotomy incision was then identified on ring forceps.  The uterine cavity was cleaned with a moist lap sponge.  The hysterotomy incision was closed with a running interlocking suture of Vicryl.  Hemostasis was excellent.  Pitocin was run in the IV and the uterus was found to be firm. The fallopian tubes were identified  and followed out to their fine fimbriated ends.  At the patient request the entire tube was removed and the upper aspect of the broad ligament was oversewn providing excellent hemostasis.  Tubal segments were sent to pathology. The posterior cul-de-sac and gutters were cleaned and inspected.  Hemostasis was noted.  The fascia was then closed with a running suture of #1 Vicryl.  Hemostasis of the subcutaneous tissues was obtained using the Bovie.  The subcutaneous tissues were closed with a running suture of 000 Vicryl.  A subcuticular suture was placed.  Steri-Strips were applied in the usual manner.  A pressure dressing was placed.  The patient went to the recovery room in stable condition. Swanson, CNM provided exposure, dissection, suctioning, retraction, and general support and assistance during the procedure.   Finis Bud, M.D. 12/18/2021 10:22 AM

## 2021-12-18 NOTE — Anesthesia Preprocedure Evaluation (Signed)
Anesthesia Evaluation  Patient identified by MRN, date of birth, ID band Patient awake    Reviewed: Allergy & Precautions, H&P , NPO status , Patient's Chart, lab work & pertinent test results  History of Anesthesia Complications Negative for: history of anesthetic complications  Airway Mallampati: I  TM Distance: >3 FB Neck ROM: full    Dental  (+) Chipped, Dental Advidsory Given   Pulmonary neg pulmonary ROS   Pulmonary exam normal        Cardiovascular Exercise Tolerance: Good (-) hypertension(-) angina (-) Past MI and (-) Cardiac Stents Normal cardiovascular exam(-) dysrhythmias (-) Valvular Problems/Murmurs     Neuro/Psych  PSYCHIATRIC DISORDERS         GI/Hepatic negative GI ROS,GERD  Medicated and Controlled,,  Endo/Other  neg diabetes  Morbid obesity  Renal/GU   negative genitourinary   Musculoskeletal   Abdominal   Peds  Hematology negative hematology ROS (+)   Anesthesia Other Findings Patient reports scoliosis   Past Medical History: No date: Adult BMI 40.0-44.9 kg/sq m (HCC) No date: Anxiety No date: GERD (gastroesophageal reflux disease) No date: Herpes No date: Obesity affecting pregnancy  Past Surgical History: No date: NO PAST SURGERIES  BMI    Body Mass Index: 51.42 kg/m      Reproductive/Obstetrics (+) Pregnancy                              Anesthesia Physical Anesthesia Plan  ASA: 3  Anesthesia Plan: Spinal   Post-op Pain Management:    Induction:   PONV Risk Score and Plan:   Airway Management Planned: Natural Airway and Nasal Cannula  Additional Equipment:   Intra-op Plan:   Post-operative Plan:   Informed Consent: I have reviewed the patients History and Physical, chart, labs and discussed the procedure including the risks, benefits and alternatives for the proposed anesthesia with the patient or authorized representative who has indicated  his/her understanding and acceptance.     Dental Advisory Given  Plan Discussed with: Anesthesiologist  Anesthesia Plan Comments: (Patient reports no bleeding problems and no anticoagulant use.   Patient consented for risks of anesthesia including but not limited to:  - adverse reactions to medications - risk of bleeding, infection and or nerve damage from epidural that could lead to paralysis - risk of headache or failed epidural - nerve damage due to positioning - Damage to heart, brain, lungs, other parts of body or loss of life  Patient voiced understanding.)         Anesthesia Quick Evaluation

## 2021-12-18 NOTE — Transfer of Care (Signed)
Immediate Anesthesia Transfer of Care Note  Patient: Kelly Baxter  Procedure(s) Performed: CESAREAN SECTION WITH BILATERAL TUBAL LIGATION  Patient Location: Mother/Baby  Anesthesia Type:Spinal  Level of Consciousness: awake, alert , and oriented  Airway & Oxygen Therapy: Patient Spontanous Breathing  Post-op Assessment: Report given to RN and Post -op Vital signs reviewed and stable  Post vital signs: Reviewed and stable  Last Vitals:  Vitals Value Taken Time  BP 119/71 1028  Temp 36.8   Pulse 63   Resp 13   SpO2 100     Last Pain:  Vitals:   12/18/21 0701  TempSrc:   PainSc: 0-No pain         Complications: No notable events documented.

## 2021-12-18 NOTE — H&P (Signed)
History and Physical   HPI  Kelly Baxter is a 31 y.o. G2P1001 at [redacted]w[redacted]d Estimated Date of Delivery: 12/23/21 who is being admitted for C-section with tubal sterilization.   OB History  OB History  Gravida Para Term Preterm AB Living  2 1 1  0 0 1  SAB IAB Ectopic Multiple Live Births  0 0 0 0 1    # Outcome Date GA Lbr Len/2nd Weight Sex Delivery Anes PTL Lv  2 Current           1 Term 12/14/19 [redacted]w[redacted]d  3440 g F CS-LTranv EPI  LIV     Name: Beaudin,PENDINGBABY     Apgar1: 8  Apgar5: 8    PROBLEM LIST  Pregnancy complications or risks: Patient Active Problem List   Diagnosis Date Noted   Term pregnancy, repeat 12/18/2021   Labor and delivery, indication for care 11/19/2021   Obesity affecting pregnancy    Supervision of high-risk pregnancy    Adult BMI 40.0-44.9 kg/sq m ([place]) 05/20/2019   Genital herpes simplex 05/20/2019   Anxiety, generalized 02/07/2017    Prenatal labs and studies: ABO, Rh: --/--/PENDING (10/27 5573) Antibody: PENDING (10/27 0558) Rubella:   RPR: NON REACTIVE (10/23 1435)  HBsAg:    HIV:    UKG:URKYHCWC/-- (09/28 1550)   Past Medical History:  Diagnosis Date   Adult BMI 40.0-44.9 kg/sq m (HCC)    Anxiety    GERD (gastroesophageal reflux disease)    Herpes    Obesity affecting pregnancy      Past Surgical History:  Procedure Laterality Date   CESAREAN SECTION  12/14/2019   Procedure: CESAREAN SECTION;  Surgeon: Will Bonnet, MD;  Location: [facility] ORS;  Service: Obstetrics;;   NO PAST SURGERIES       Medications    Current Discharge Medication List     CONTINUE these medications which have NOT CHANGED   Details  aspirin EC 81 MG tablet Take 81 mg by mouth daily. Swallow whole.    omeprazole (PRILOSEC) 40 MG capsule Take 40 mg by mouth daily.    Prenatal Vit-Fe Fumarate-FA (MULTIVITAMIN-PRENATAL) 27-0.8 MG TABS tablet Take 1 tablet by mouth daily at 12 noon.         Allergies  Aluminum, Aluminum-containing  compounds, and Other  Review of Systems  Pertinent items noted in HPI and remainder of comprehensive ROS otherwise negative.  Physical Exam  BP 114/71 (BP Location: Right Arm)   Pulse 86   Temp 98.1 F (36.7 C) (Oral)   Resp 18   Ht 5\' 4"  (1.626 m)   Wt 134.1 kg   LMP 02/15/2021 (Within Weeks)   BMI 50.75 kg/m   Lungs:  CTA B Cardio: RRR without M/R/G Abd: Soft, gravid, NT Presentation: cephalic EXT: No C/C/ 1+ Edema DTRs: 2+ B CERVIX:    See Prenatal records for more detailed PE.     FHR:  Variability: Good {> 6 bpm)  Toco: Uterine Contractions: None  Test Results  Results for orders placed or performed during the hospital encounter of 12/18/21 (from the past 24 hour(s))  Type and screen [place]     Status: None (Preliminary result)   Collection Time: 12/18/21  5:58 AM  Result Value Ref Range   ABO/RH(D) PENDING    Antibody Screen PENDING    Sample Expiration      12/21/2021,2359 Performed at [facility], [address], [place], [place] [address]  Assessment   G2P1001 at [redacted]w[redacted]d Estimated Date of Delivery: 12/23/21  The fetus is reassuring.   Patient Active Problem List   Diagnosis Date Noted   Term pregnancy, repeat 12/18/2021   Labor and delivery, indication for care 11/19/2021   Obesity affecting pregnancy    Supervision of high-risk pregnancy    Adult BMI 40.0-44.9 kg/sq m ([place]) 05/20/2019   Genital herpes simplex 05/20/2019   Anxiety, generalized 02/07/2017    Plan  1. Admit to L&D :   2. EFM: -- Category 1 3. Planned CD with tubal  Finis Bud, M.D. 12/18/2021 7:47 AM

## 2021-12-18 NOTE — Anesthesia Procedure Notes (Signed)
Spinal  Patient location during procedure: OR Start time: 12/18/2021 8:35 AM End time: 12/18/2021 8:39 AM Reason for block: surgical anesthesia Staffing Performed: resident/CRNA  Anesthesiologist: Martha Clan, MD Resident/CRNA: Hedda Slade, CRNA Performed by: Hedda Slade, CRNA Authorized by: Martha Clan, MD   Preanesthetic Checklist Completed: patient identified, IV checked, site marked, risks and benefits discussed, surgical consent, monitors and equipment checked, pre-op evaluation and timeout performed Spinal Block Patient position: sitting Prep: ChloraPrep Patient monitoring: heart rate, continuous pulse ox, blood pressure and cardiac monitor Approach: midline Location: L3-4 Injection technique: single-shot Needle Needle type: Whitacre and Introducer  Needle gauge: 24 G Needle length: 9 cm Assessment Events: CSF return Additional Notes Sterile aseptic technique used throughout the procedure.  Negative paresthesia. Negative blood return. Positive free-flowing CSF. Expiration date of kit checked and confirmed. Patient tolerated procedure well, without complications.

## 2021-12-19 LAB — CBC
HCT: 29.4 % — ABNORMAL LOW (ref 36.0–46.0)
Hemoglobin: 9.4 g/dL — ABNORMAL LOW (ref 12.0–15.0)
MCH: 25.8 pg — ABNORMAL LOW (ref 26.0–34.0)
MCHC: 32 g/dL (ref 30.0–36.0)
MCV: 80.5 fL (ref 80.0–100.0)
Platelets: 207 10*3/uL (ref 150–400)
RBC: 3.65 MIL/uL — ABNORMAL LOW (ref 3.87–5.11)
RDW: 15.1 % (ref 11.5–15.5)
WBC: 10.5 10*3/uL (ref 4.0–10.5)
nRBC: 0 % (ref 0.0–0.2)

## 2021-12-19 MED ORDER — IBUPROFEN 600 MG PO TABS: 600.0000 mg | ORAL_TABLET | Freq: Four times a day (QID) | ORAL | 0 refills | Status: DC

## 2021-12-19 NOTE — Plan of Care (Signed)
Pt in bed in low, locked position, with call bell in reach. Baby flat on back in bassinet. Pt denies pain. Explained that she could shower and remove pressure dressing and it will be replaced with honeycomb.Will continue to monitor.

## 2021-12-19 NOTE — Discharge Instructions (Signed)
Discharge Instructions:  ° °Follow-up Appointment:  ° °If there are any new medications, they have been ordered and will be available for pickup at the listed pharmacy on your way home from the hospital.  ° °Call office if you have any of the following: headache, visual changes, fever >101.0 F, chills, shortness of breath, breast concerns, excessive vaginal bleeding, incision drainage or problems, leg pain or redness, depression or any other concerns. If you have vaginal discharge with an odor, let your doctor know.  ° °It is normal to bleed for up to 6 weeks. You should not soak through more than 1 pad in 1 hour. If you have a blood clot larger than your fist with continued bleeding, call your doctor.  ° °After a c-section, you should expect a small amount of blood or clear fluid coming from the incision and abdominal cramping/soreness. Inspect your incision site daily. Stand in front of a mirror to look for any redness, incision opening, or discolored/odorness drainage. Take a shower daily and continue good hygiene. Use own towel and washcloth (do not share). Make sure your sheets on your bed are clean. No pets sleeping around your incision site. Dressing will be removed at your postpartum visit. If the dressing does become wet or soiled underneath, it is okay to remove it.  ° °On-Q pump: You will remove on day 5 after insertion or if the ball becomes flat before day 5. You will remove on: Jun 25, 2018 ° °Activity: Do not lift > 10 lbs for 6 weeks (do not lift anything heavier than your baby). °No intercourse, tampons, swimming pools, hot tubs, baths (only showers) for 6 weeks.  °No driving for 1-2 weeks. °Continue prenatal vitamin, especially if breastfeeding. °Increase calories and fluids (water) while breastfeeding.  ° °Your milk will come in, in the next couple of days (right now it is colostrum). You may have a slight fever when your milk comes in, but it should go away on its own.  If it does not, and rises  above 101 F please call the doctor. You will also feel achy and your breasts will be firm. They will also start to leak. If you are breastfeeding, continue as you have been and you can pump/express milk for comfort.  ° °If you have too much milk, your breasts can become engorged, which could lead to mastitis. This is an infection of the milk ducts. It can be very painful and you will need to notify your doctor to obtain a prescription for antibiotics. You can also treat it with a shower or hot/cold compress.  ° °For concerns about your baby, please call your pediatrician.  °For breastfeeding concerns, the lactation consultant can be reached at [phone].  ° °Postpartum blues (feelings of happy one minute and sad another minute) are normal for the first few weeks but if it gets worse let your doctor know.  ° °Congratulations! We enjoyed caring for you and your new bundle of joy!  °

## 2021-12-19 NOTE — Progress Notes (Signed)
Pt given all discharge instructions, pt verbalized understanding. Pt to be wheeled down to car with spouse and baby.

## 2021-12-19 NOTE — Plan of Care (Signed)
All education complete.

## 2021-12-19 NOTE — Discharge Summary (Signed)
Physician Obstetric Discharge Summary  Patient Name: Kelly Baxter DOB: [birth year] MRN: [id number]                            Discharge Summary  Date of Admission: 12/18/2021 Date of Discharge: 12/19/2021 Delivering Provider: Harlin Heys   Admitting Diagnosis: Term pregnancy, repeat [Z34.90] at [redacted]w[redacted]d Secondary diagnosis:  Principal Problem:   Term pregnancy, repeat    Desires permanent sterilization by salpingectomy  Mode of Delivery:       low uterine, transverse     Discharge diagnosis: Term Pregnancy Delivered      Complications: none                     Discharge Day SOAP Note:  Subjective:  The patient has no complaints.  She is ambulating well. She is taking PO well. Pain is well controlled with current medications. Patient is urinating without difficulty.   She is passing flatus.  She strongly desires discharge today  Objective  Vital signs: BP 125/68 (BP Location: Right Arm)   Pulse 71   Temp 98.4 F (36.9 C) (Oral)   Resp 20   Ht 5\' 4"  (1.626 m)   Wt 134.1 kg   LMP 02/15/2021 (Within Weeks)   SpO2 98%   Breastfeeding Unknown   BMI 50.75 kg/m   Physical Exam: Gen: NAD Abdomen:  Dressing intact Fundus Fundal Tone: Firm  Lochia Amount: Scant     Data Review Labs: Lab Results  Component Value Date   WBC 10.5 12/19/2021   HGB 9.4 (L) 12/19/2021   HCT 29.4 (L) 12/19/2021   MCV 80.5 12/19/2021   PLT 207 12/19/2021      Latest Ref Rng & Units 12/19/2021    6:44 AM 12/18/2021    5:58 AM 12/14/2021    2:35 PM  CBC  WBC 4.0 - 10.5 K/uL 10.5  12.5  11.6   Hemoglobin 12.0 - 15.0 g/dL 9.4  10.9  10.8   Hematocrit 36.0 - 46.0 % 29.4  34.1  33.6   Platelets 150 - 400 K/uL 207  256  247    O POS  Edinburgh Score:    12/19/2021    1:40 AM  Edinburgh Postnatal Depression Scale Screening Tool  I have been able to laugh and see the funny side of things. 0  I have looked forward with enjoyment to things. 0  I have blamed myself  unnecessarily when things went wrong. 0  I have been anxious or worried for no good reason. 0  I have felt scared or panicky for no good reason. 0  Things have been getting on top of me. 0  I have been so unhappy that I have had difficulty sleeping. 0  I have felt sad or miserable. 0  I have been so unhappy that I have been crying. 0  The thought of harming myself has occurred to me. 0  Edinburgh Postnatal Depression Scale Total 0    Assessment:  Principal Problem:   Term pregnancy, repeat   Doing well.  Normal progress as expected. Pain under control, ambulating voiding eating without difficulty.  Not using narcotics.  Strongly desires discharge. Plan:  Discharge to home  Modified rest as directed - may slowly resume normal activities with restrictions  as discussed.  Medications as written.  See below for additional.      Discharge Instructions: Per After Visit Summary. Activity: Advance  as tolerated. Pelvic rest for 6 weeks.  Also refer to After Visit Summary.  Wound care discussed. Diet: Regular Medications: Allergies as of 12/19/2021       Reactions   Aluminum Hives, Rash   Aluminum-containing Compounds Hives   Other    Trees, weeds, grass, dust mites.        Medication List     STOP taking these medications    aspirin EC 81 MG tablet       TAKE these medications    ibuprofen 600 MG tablet Commonly known as: ADVIL Take 1 tablet (600 mg total) by mouth every 6 (six) hours.   multivitamin-prenatal 27-0.8 MG Tabs tablet Take 1 tablet by mouth daily at 12 noon.   omeprazole 40 MG capsule Commonly known as: PRILOSEC Take 40 mg by mouth daily.               Discharge Care Instructions  (From admission, onward)           Start     Ordered   12/19/21 0000  No dressing needed       Comments: Keep wound area clean and dry   12/19/21 1350           Outpatient follow up:   Follow-up Information     Harlin Heys, MD Follow up in  1 week(s).   Specialties: Obstetrics and Gynecology, Radiology Why: As scheduled Contact information: [place] [address] [phone]                Postpartum contraception: Will discuss at first post-partum visit.  Discharged Condition: good  Discharged to: home  Newborn Data: Disposition:home with mother  Apgars: APGAR (1 MIN): 8   APGAR (5 MINS): 9   APGAR (10 MINS):    Baby Feeding: Bottle  Finis Bud, M.D. 12/19/2021 1:50 PM

## 2021-12-19 NOTE — Anesthesia Postprocedure Evaluation (Signed)
Anesthesia Post Note  Patient: Kelly Baxter  Procedure(s) Performed: CESAREAN SECTION WITH BILATERAL TUBAL LIGATION  Patient location during evaluation: Mother Baby Anesthesia Type: Spinal Level of consciousness: oriented and awake and alert Pain management: pain level controlled Vital Signs Assessment: post-procedure vital signs reviewed and stable Respiratory status: spontaneous breathing Cardiovascular status: blood pressure returned to baseline and stable Postop Assessment: no headache, no backache, no apparent nausea or vomiting, patient able to bend at knees and able to ambulate Anesthetic complications: no   No notable events documented.   Last Vitals:  Vitals:   12/19/21 0500 12/19/21 0750  BP:  125/68  Pulse: 85 71  Resp:  20  Temp:  36.9 C  SpO2: 94% 98%    Last Pain:  Vitals:   12/19/21 0750  TempSrc: Oral  PainSc:                  Precious Haws [place]ya Alia

## 2021-12-19 NOTE — Anesthesia Post-op Follow-up Note (Signed)
  Anesthesia Pain Follow-up Note  Patient: Kelly Baxter  Day #: 1  Date of Follow-up: 12/19/2021 Time: 8:02 AM  Last Vitals:  Vitals:   12/19/21 0500 12/19/21 0750  BP:  125/68  Pulse: 85 71  Resp:  20  Temp:  36.9 C  SpO2: 94% 98%    Level of Consciousness: alert  Pain: mild   Side Effects:None  Catheter Site Exam:clean, dry     Plan: D/C from anesthesia care at surgeon's request  [place]

## 2021-12-21 LAB — SURGICAL PATHOLOGY

## 2021-12-22 ENCOUNTER — Telehealth: Payer: Self-pay

## 2021-12-22 NOTE — Telephone Encounter (Signed)
Pt calling; delivered Fri., breasts are engorged and swollen up into her armpit; what to do?  Adv to be seen; then adv pt to express milk; apply heat/ice to area; take advil or tylenol and if doesn't help to be seen.  Pt is not breastfeeding and has an appt on Thursday.

## 2021-12-24 ENCOUNTER — Encounter: Payer: Self-pay | Admitting: Obstetrics and Gynecology

## 2021-12-24 ENCOUNTER — Ambulatory Visit (INDEPENDENT_AMBULATORY_CARE_PROVIDER_SITE_OTHER): Payer: Medicaid Other | Admitting: Obstetrics and Gynecology

## 2021-12-24 DIAGNOSIS — Z9889 Other specified postprocedural states: Secondary | ICD-10-CM

## 2021-12-24 NOTE — Progress Notes (Signed)
Patient presents today for 1 week postpartum follow-up. Patient had a cesarean delivery on 12/18/21.  She is bottle feeding but has complaints of a knot in her breast that is painful. She has had a BTL for birth control. EPDS score of 0 . Patient states no other questions or concerns at this time.

## 2021-12-24 NOTE — Progress Notes (Signed)
HPI:      Kelly Baxter is a 31 y.o. [phone] who LMP was No LMP recorded.  Subjective:   She presents today 1 week from cesarean delivery.  She states that she has a bulge under her arm that is tender and both of her breasts are tender.  She is not breast-feeding.  She reports no fevers or chills. She says her abdomen feels much better-still has honeycomb dressing in place.    Hx: The following portions of the patient's history were reviewed and updated as appropriate:             She  has a past medical history of Adult BMI 40.0-44.9 kg/sq m ([place]), Anxiety, GERD (gastroesophageal reflux disease), Herpes, and Obesity affecting pregnancy. She does not have any pertinent problems on file. She  has a past surgical history that includes No past surgeries; Cesarean section (12/14/2019); and Cesarean section with bilateral tubal ligation (N/A, 12/18/2021). Her family history includes Diabetes in her maternal aunt, maternal aunt, maternal grandfather, and maternal grandmother; Heart Problems in her mother; Hypertension in her maternal grandfather; Thyroid disease in her mother. She  reports that she has never smoked. She has never used smokeless tobacco. She reports that she does not currently use alcohol. She reports that she does not use drugs. She has a current medication list which includes the following prescription(s): ibuprofen, multivitamin-prenatal, and [DISCONTINUED] cetirizine. She is allergic to aluminum, aluminum-containing compounds, and other.       Review of Systems:  Review of Systems  Constitutional: Denied constitutional symptoms, night sweats, recent illness, fatigue, fever, insomnia and weight loss.  Eyes: Denied eye symptoms, eye pain, photophobia, vision change and visual disturbance.  Ears/Nose/Throat/Neck: Denied ear, nose, throat or neck symptoms, hearing loss, nasal discharge, sinus congestion and sore throat.  Cardiovascular: Denied cardiovascular symptoms,  arrhythmia, chest pain/pressure, edema, exercise intolerance, orthopnea and palpitations.  Respiratory: Denied pulmonary symptoms, asthma, pleuritic pain, productive sputum, cough, dyspnea and wheezing.  Gastrointestinal: Denied, gastro-esophageal reflux, melena, nausea and vomiting.  Genitourinary: Denied genitourinary symptoms including symptomatic vaginal discharge, pelvic relaxation issues, and urinary complaints.  Musculoskeletal: Denied musculoskeletal symptoms, stiffness, swelling, muscle weakness and myalgia.  Dermatologic: Denied dermatology symptoms, rash and scar.  Neurologic: Denied neurology symptoms, dizziness, headache, neck pain and syncope.  Psychiatric: Denied psychiatric symptoms, anxiety and depression.  Endocrine: Denied endocrine symptoms including hot flashes and night sweats.   Meds:   Current Outpatient Medications on File Prior to Visit  Medication Sig Dispense Refill   ibuprofen (ADVIL) 600 MG tablet Take 1 tablet (600 mg total) by mouth every 6 (six) hours. 30 tablet 0   Prenatal Vit-Fe Fumarate-FA (MULTIVITAMIN-PRENATAL) 27-0.8 MG TABS tablet Take 1 tablet by mouth daily at 12 noon.     [DISCONTINUED] cetirizine (ZYRTEC) 10 MG tablet Take 10 mg by mouth daily.     No current facility-administered medications on file prior to visit.      Objective:     Vitals:   12/24/21 1433  BP: 124/81  Pulse: 79   Filed Weights   12/24/21 1433  Weight: 289 lb 6.4 oz (131.3 kg)              Examination of the left axilla reveals enlargement consistent with breast tissue.  There is no erythema.  Breast and axilla are slightly tender but likely consistent with breast tenderness when not breast-feeding.         Abdomen: Soft.  Non-tender.  No masses.  No HSM.  Incision/s: Intact.  Healing well.  No erythema.  No drainage.     Assessment:    [id number] Patient Active Problem List   Diagnosis Date Noted   Term pregnancy, repeat 12/18/2021   Labor and delivery,  indication for care 11/19/2021   Obesity affecting pregnancy    Supervision of high-risk pregnancy    Adult BMI 40.0-44.9 kg/sq m ([place]) 05/20/2019   Genital herpes simplex 05/20/2019   Anxiety, generalized 02/07/2017     1. Postpartum care following cesarean delivery   2. Post-operative state      Strongly doubt mastitis.  Patient otherwise doing very well postpartum and postop.     Plan:            1.  Discussed expectant management of breast tissue-will decrease in size.  Tight fitting bra cabbage leaves etc. discussed.  Patient will inform us if she begins having fevers or if the breast becomes red. 2.  Advise removal of honeycomb dressing.  Wound care discussed in detail. Orders No orders of the defined types were placed in this encounter.   No orders of the defined types were placed in this encounter.     F/U  No follow-ups on file.  Finis Bud, M.D. 12/24/2021 2:52 PM

## 2021-12-25 ENCOUNTER — Encounter: Payer: Medicaid Other | Admitting: Obstetrics and Gynecology

## 2022-01-20 ENCOUNTER — Encounter: Payer: Self-pay | Admitting: Obstetrics and Gynecology

## 2022-01-21 ENCOUNTER — Encounter: Payer: Medicaid Other | Admitting: Obstetrics and Gynecology

## 2022-01-21 ENCOUNTER — Telehealth: Payer: Medicaid Other | Admitting: Nurse Practitioner

## 2022-01-21 DIAGNOSIS — T8189XA Other complications of procedures, not elsewhere classified, initial encounter: Secondary | ICD-10-CM | POA: Diagnosis not present

## 2022-01-21 MED ORDER — MUPIROCIN 2 % EX OINT: 1.0000 | TOPICAL_OINTMENT | Freq: Two times a day (BID) | CUTANEOUS | 0 refills | Status: DC

## 2022-01-21 NOTE — Progress Notes (Addendum)
Virtual Visit Consent   [name], you are scheduled for a virtual visit with a [facility] provider today. Just as with appointments in the office, your consent must be obtained to participate. Your consent will be active for this visit and any virtual visit you may have with one of our providers in the next 365 days. If you have a MyChart account, a copy of this consent can be sent to you electronically.  As this is a virtual visit, video technology does not allow for your provider to perform a traditional examination. This may limit your provider's ability to fully assess your condition. If your provider identifies any concerns that need to be evaluated in person or the need to arrange testing (such as labs, EKG, etc.), we will make arrangements to do so. Although advances in technology are sophisticated, we cannot ensure that it will always work on either your end or our end. If the connection with a video visit is poor, the visit may have to be switched to a telephone visit. With either a video or telephone visit, we are not always able to ensure that we have a secure connection.  By engaging in this virtual visit, you consent to the provision of healthcare and authorize for your insurance to be billed (if applicable) for the services provided during this visit. Depending on your insurance coverage, you may receive a charge related to this service.  I need to obtain your verbal consent now. Are you willing to proceed with your visit today? KEERICA PORTER has provided verbal consent on 01/21/2022 for a virtual visit (video or telephone). Gildardo Pounds, NP  Date: 01/21/2022 4:51 PM  Virtual Visit via Video Note   I, Gildardo Pounds, connected with  KISTY KAMAN  (PU:[id number], 1990-09-19) on 01/21/22 at  4:15 PM EST by a video-enabled telemedicine application and verified that I am speaking with the correct person using two identifiers.  Location: Patient: Virtual Visit Location  Patient: Home Provider: Virtual Visit Location Provider: Home Office   I discussed the limitations of evaluation and management by telemedicine and the availability of in person appointments. The patient expressed understanding and agreed to proceed.    History of Present Illness: Kelly Baxter is a 31 y.o. who identifies as a female who was assigned female at birth, and is being seen today for surgical incision skin infection.  Kelly Baxter is status post C-section with bilateral tubal ligation on December 18, 2021.  Today she notes mild redness around the incision site.  Denies fever, purulent drainage, pain or worsening erythema.  She attached a picture of the incision through her MyChart earlier today and was instructed by OB/GYN to keep the area dry and clean and to let them know if any fever develops or any drainage.  Despite instructions from OB/GYN she made a virtual visit today with concerns possible infection of the surgical site.  Problems:  Patient Active Problem List   Diagnosis Date Noted   Term pregnancy, repeat 12/18/2021   Labor and delivery, indication for care 11/19/2021   Obesity affecting pregnancy    Supervision of high-risk pregnancy    Adult BMI 40.0-44.9 kg/sq m ([place]) 05/20/2019   Genital herpes simplex 05/20/2019   Anxiety, generalized 02/07/2017    Allergies:  Allergies  Allergen Reactions   Aluminum Hives and Rash   Aluminum-Containing Compounds Hives   Other     Trees, weeds, grass, dust mites.   Medications:  Current Outpatient  Medications:    mupirocin ointment (BACTROBAN) 2 %, Apply 1 Application topically 2 (two) times daily., Disp: 30 g, Rfl: 0   ibuprofen (ADVIL) 600 MG tablet, Take 1 tablet (600 mg total) by mouth every 6 (six) hours., Disp: 30 tablet, Rfl: 0   Prenatal Vit-Fe Fumarate-FA (MULTIVITAMIN-PRENATAL) 27-0.8 MG TABS tablet, Take 1 tablet by mouth daily at 12 noon., Disp: , Rfl:   Observations/Objective: Patient is well-developed,  well-nourished in no acute distress.  Resting comfortably at home.  Head is normocephalic, atraumatic.  No labored breathing.  Speech is clear and coherent with logical content.  Patient is alert and oriented at baseline.    Assessment and Plan: 1. Problem involving surgical incision - mupirocin ointment (BACTROBAN) 2 %; Apply 1 Application topically 2 (two) times daily.  Dispense: 30 g; Refill: 0 See photo attached to [employer] today.  Based on photo attached through MyChart there does not appear to be any significant infection of the surgical site Follow up with OB GYN if devlelops fever, purulent drainage or erythema worsens around incision site  Follow Up Instructions: I discussed the assessment and treatment plan with the patient. The patient was provided an opportunity to ask questions and all were answered. The patient agreed with the plan and demonstrated an understanding of the instructions.  A copy of instructions were sent to the patient via MyChart unless otherwise noted below.    The patient was advised to call back or seek an in-person evaluation if the symptoms worsen or if the condition fails to improve as anticipated.  Time:  I spent 12 minutes with the patient via telehealth technology discussing the above problems/concerns.    Claiborne Rigg, NP

## 2022-01-21 NOTE — Patient Instructions (Signed)
  [name], thank you for joining Claiborne Rigg, NP for today's virtual visit.  While this provider is not your primary care provider (PCP), if your PCP is located in our provider database this encounter information will be shared with them immediately following your visit.   A [facility] MyChart account gives you access to today's visit and all your visits, tests, and labs performed at [facility] " click here if you don't have a [facility] MyChart account or go to [email or web address]  Consent: (Patient) Kelly Baxter provided verbal consent for this virtual visit at the beginning of the encounter.  Current Medications:  Current Outpatient Medications:    mupirocin ointment (BACTROBAN) 2 %, Apply 1 Application topically 2 (two) times daily., Disp: 30 g, Rfl: 0   ibuprofen (ADVIL) 600 MG tablet, Take 1 tablet (600 mg total) by mouth every 6 (six) hours., Disp: 30 tablet, Rfl: 0   Prenatal Vit-Fe Fumarate-FA (MULTIVITAMIN-PRENATAL) 27-0.8 MG TABS tablet, Take 1 tablet by mouth daily at 12 noon., Disp: , Rfl:    Medications ordered in this encounter:  Meds ordered this encounter  Medications   mupirocin ointment (BACTROBAN) 2 %    Sig: Apply 1 Application topically 2 (two) times daily.    Dispense:  30 g    Refill:  0    Order Specific Question:   Supervising Provider    Answer:   Merrilee Jansky [id number]     *If you need refills on other medications prior to your next appointment, please contact your pharmacy*  Follow-Up: Call back or seek an in-person evaluation if the symptoms worsen or if the condition fails to improve as anticipated.  [facility] [phone]  Other Instructions Follow up with OB GYN if devlelops fever, purulent drainage or erythema worsens around incision site   If you have been instructed to have an in-person evaluation today at a local Urgent Care facility, please use the link below. It will take you to a  list of all of our available [facility], including address, phone number and hours of operation. Please do not delay care.  [facility]  If you or a family member do not have a primary care provider, use the link below to schedule a visit and establish care. When you choose a [facility] primary care physician or advanced practice provider, you gain a long-term partner in health. Find a Primary Care Provider  Learn more about [facility]'s in-office and virtual care options: [facility] - Get Care Now

## 2022-02-04 ENCOUNTER — Encounter: Payer: Medicaid Other | Admitting: Obstetrics and Gynecology

## 2022-02-04 DIAGNOSIS — Z9889 Other specified postprocedural states: Secondary | ICD-10-CM

## 2022-02-05 ENCOUNTER — Telehealth: Payer: Self-pay | Admitting: Obstetrics and Gynecology

## 2022-02-05 NOTE — Telephone Encounter (Signed)
Reached out to pt to reschedule post-op appt that was scheduled on 12/14 with Dr. Logan Bores at 3:30.  Left message for pt to call back to reschedule.

## 2022-02-09 ENCOUNTER — Encounter: Payer: Self-pay | Admitting: Obstetrics and Gynecology

## 2022-02-09 NOTE — Telephone Encounter (Signed)
Reached out to pt (2x) to reschedule post-op appt that was scheduled on 12/14 with Dr. Logan Bores at 3:30.  Left message for pt to call back to reschedule.  Will send a MyChart letter.

## 2022-06-23 NOTE — Telephone Encounter (Signed)
Error

## 2022-07-14 IMAGING — US US OB < 14 WEEKS - US OB TV
1 series · 14 of 28 positions shown · non-contrast
Comparison: None.

CLINICAL DATA: Dating.

EXAM:
OBSTETRIC <14 WK US AND TRANSVAGINAL OB US
TECHNIQUE: Both transabdominal and transvaginal ultrasound examinations were
performed for complete evaluation of the gestation as well as the
maternal uterus, adnexal regions, and pelvic cul-de-sac.
Transvaginal technique was performed to assess early pregnancy.

[Series 1: us ob less than 14 weeks with ob transvaginal · 14 of 162 slices shown]
[im 6/162]
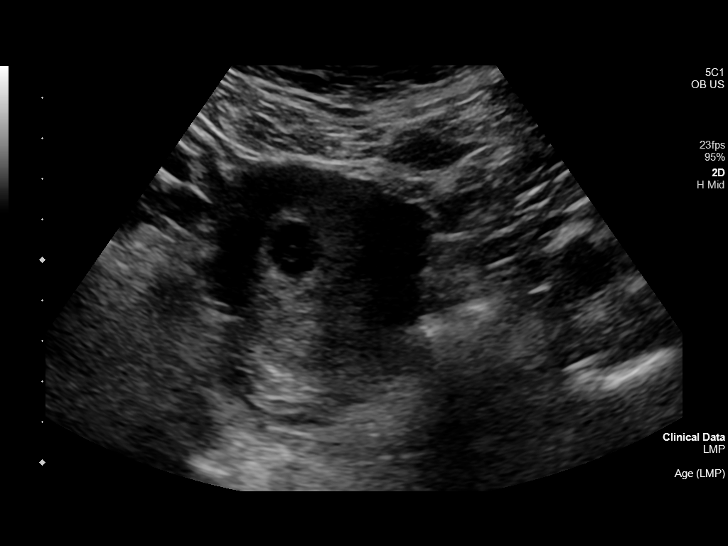
[im 18/162]
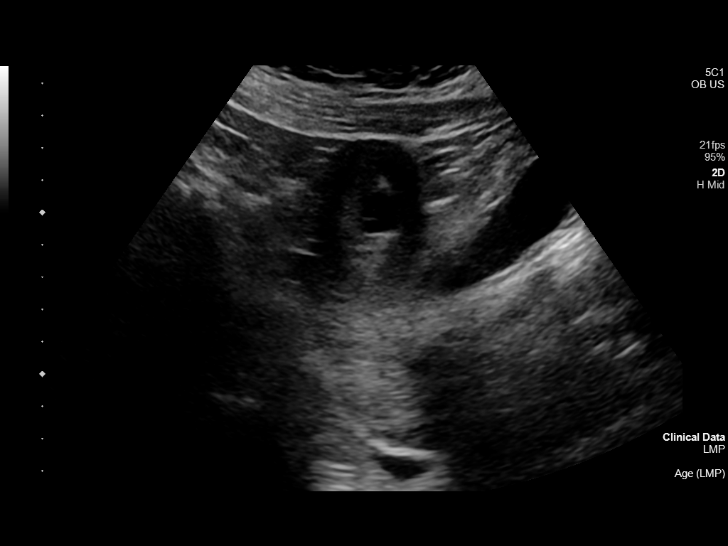
[im 30/162]
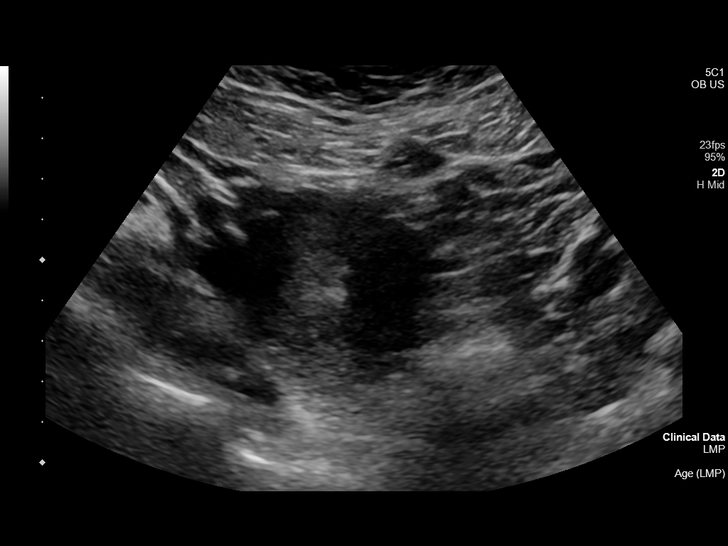
[im 42/162]
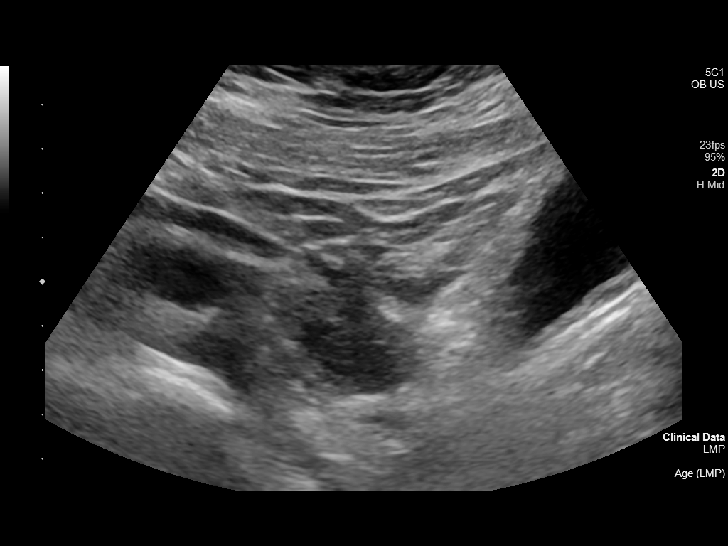
[im 54/162]
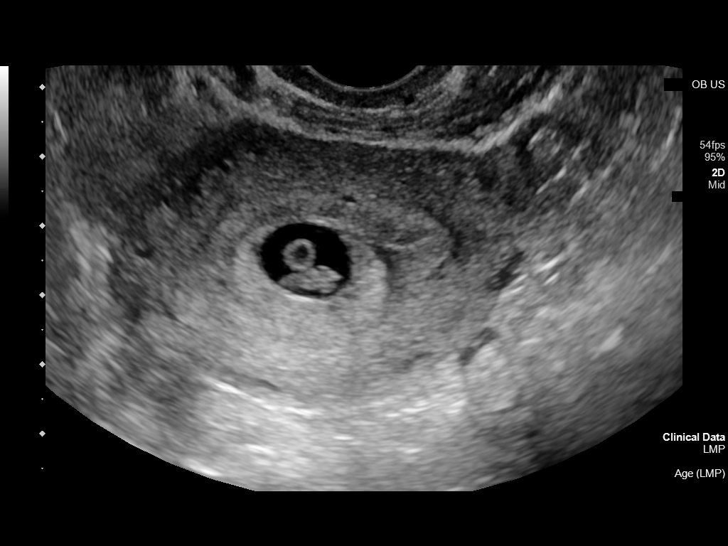
[im 66/162]
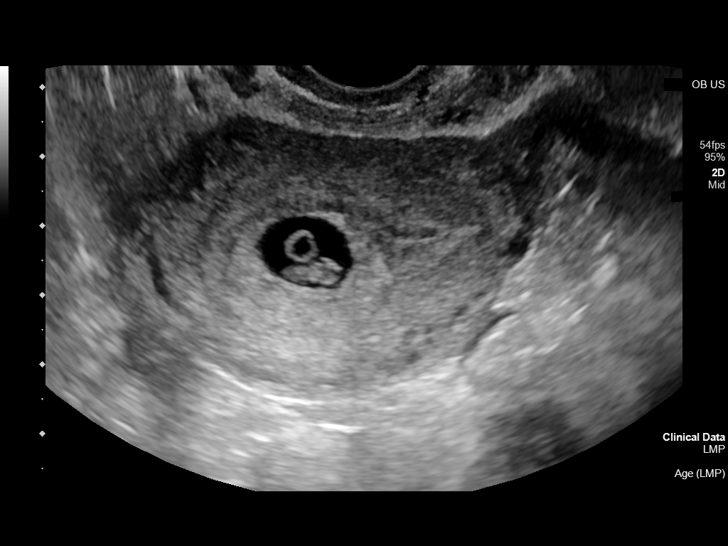
[im 78/162]
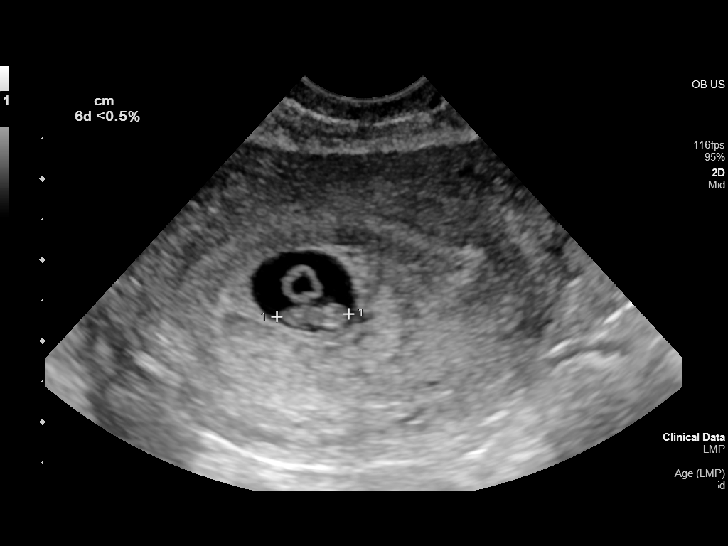
[im 90/162]
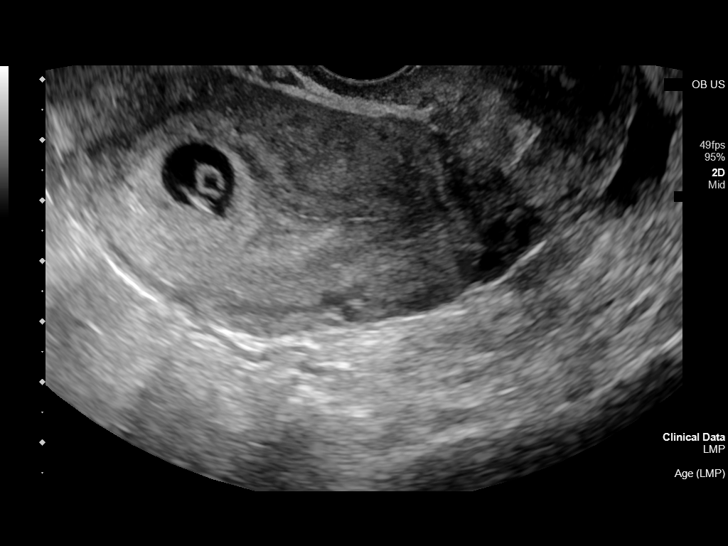
[im 102/162]
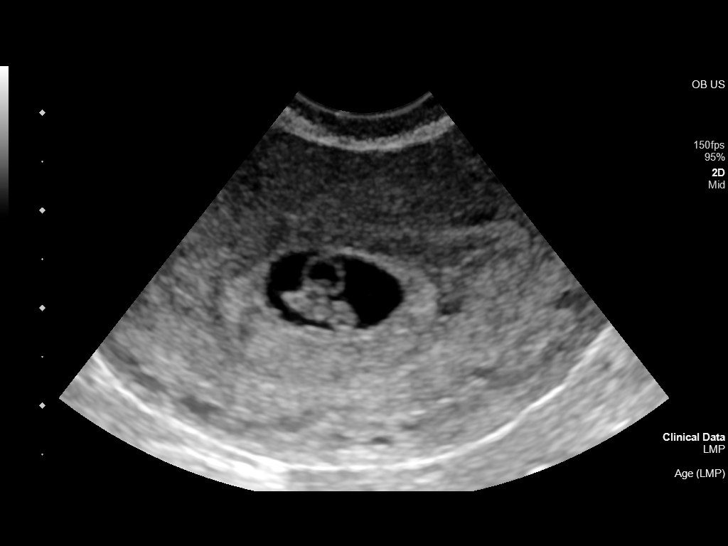
[im 114/162]
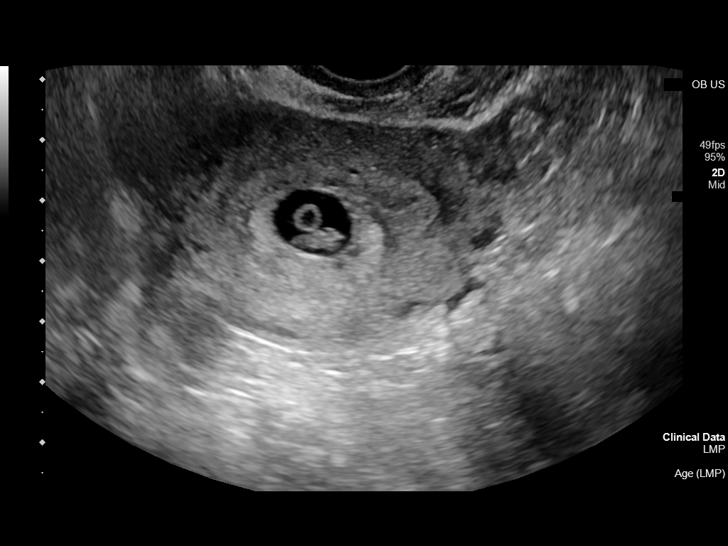
[im 126/162]
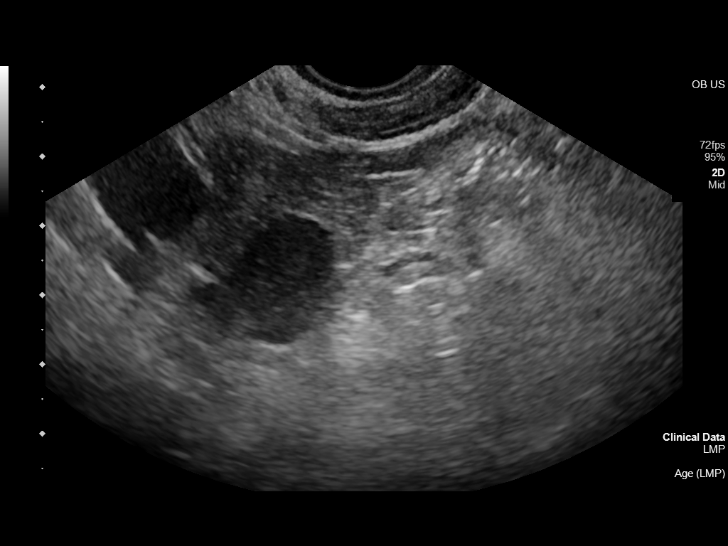
[im 138/162]
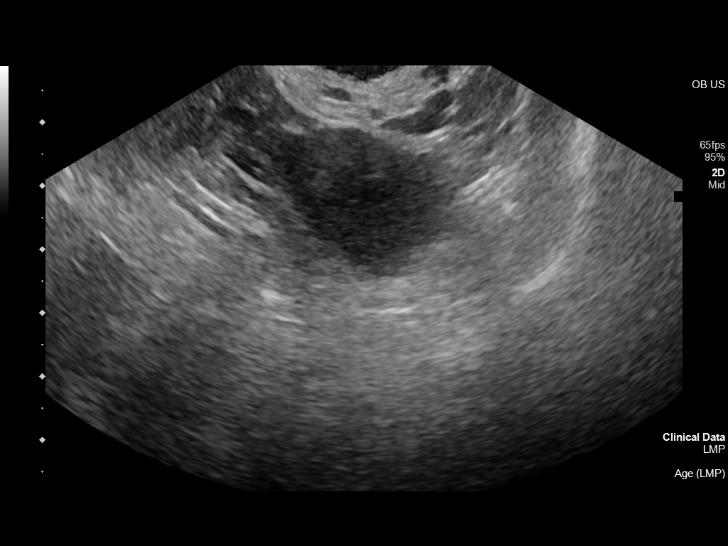
[im 150/162]
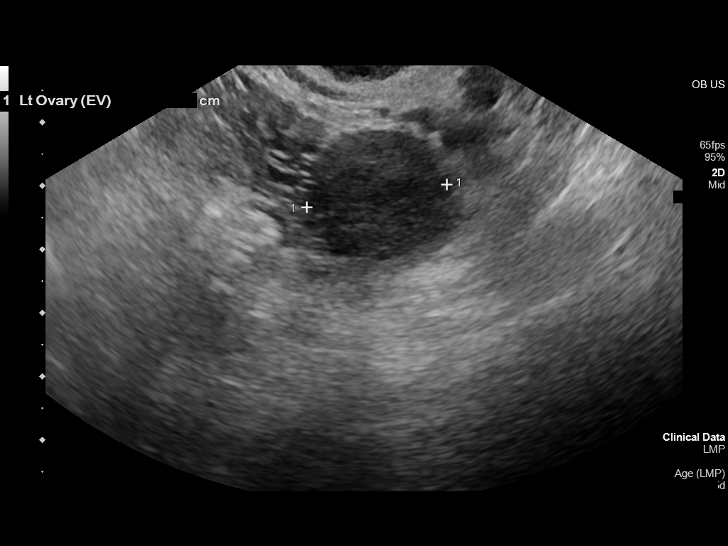
[im 162/162]
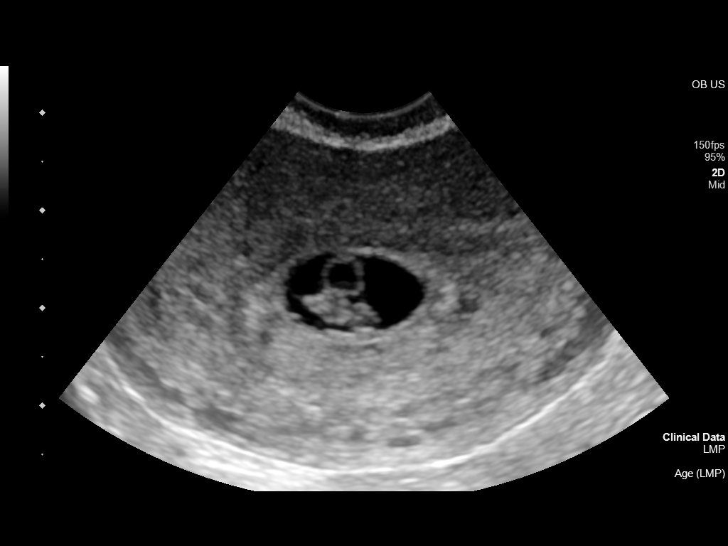

[14 of 28 positions shown; findings below may reference images not displayed]

FINDINGS: Intrauterine gestational sac: Single

Yolk sac:  Visualized.

Embryo:  Visualized.

Cardiac Activity: Visualized.

Heart Rate: 125 bpm

CRL:  8.8 mm   6 w   6 d                  US EDC: December 23, 2021

Subchorionic hemorrhage:  None visualized.

Maternal uterus/adnexae: Bilateral ovaries are visualized and are
normal in appearance.

A trace amount of pelvic free fluid is seen.
IMPRESSION: Single, viable intrauterine pregnancy at approximately 6 weeks and 6
days gestation by ultrasound evaluation.

## 2023-03-04 ENCOUNTER — Emergency Department
Admission: EM | Admit: 2023-03-04 | Discharge: 2023-03-04 | Discharge status: Against Medical Advice | Payer: Medicaid Other | Attending: Emergency Medicine | Admitting: Emergency Medicine

## 2023-03-04 ENCOUNTER — Other Ambulatory Visit: Payer: Self-pay

## 2023-03-04 ENCOUNTER — Ambulatory Visit
Admission: EM | Admit: 2023-03-04 | Discharge: 2023-03-04 | Disposition: A | Discharge status: Home / Self Care | Payer: Medicaid Other | Attending: Emergency Medicine | Admitting: Emergency Medicine

## 2023-03-04 DIAGNOSIS — R42 Dizziness and giddiness: Secondary | ICD-10-CM | POA: Diagnosis not present

## 2023-03-04 DIAGNOSIS — Z5321 Procedure and treatment not carried out due to patient leaving prior to being seen by health care provider: Secondary | ICD-10-CM | POA: Insufficient documentation

## 2023-03-04 DIAGNOSIS — K219 Gastro-esophageal reflux disease without esophagitis: Secondary | ICD-10-CM | POA: Insufficient documentation

## 2023-03-04 DIAGNOSIS — K529 Noninfective gastroenteritis and colitis, unspecified: Secondary | ICD-10-CM | POA: Insufficient documentation

## 2023-03-04 DIAGNOSIS — R1110 Vomiting, unspecified: Secondary | ICD-10-CM | POA: Insufficient documentation

## 2023-03-04 DIAGNOSIS — R197 Diarrhea, unspecified: Secondary | ICD-10-CM | POA: Insufficient documentation

## 2023-03-04 DIAGNOSIS — F419 Anxiety disorder, unspecified: Secondary | ICD-10-CM | POA: Insufficient documentation

## 2023-03-04 DIAGNOSIS — E860 Dehydration: Secondary | ICD-10-CM | POA: Diagnosis present

## 2023-03-04 LAB — COMPREHENSIVE METABOLIC PANEL
ALT: 18 U/L (ref 0–44)
AST: 20 U/L (ref 15–41)
Albumin: 4.3 g/dL (ref 3.5–5.0)
Alkaline Phosphatase: 87 U/L (ref 38–126)
Anion gap: 11 (ref 5–15)
BUN: 11 mg/dL (ref 6–20)
CO2: 21 mmol/L — ABNORMAL LOW (ref 22–32)
Calcium: 9.1 mg/dL (ref 8.9–10.3)
Chloride: 105 mmol/L (ref 98–111)
Creatinine, Ser: 0.94 mg/dL (ref 0.44–1.00)
GFR, Estimated: >60 mL/min (ref >60)
Glucose, Bld: 100 mg/dL — ABNORMAL HIGH (ref 70–99)
Potassium: 3.5 mmol/L (ref 3.5–5.1)
Sodium: 137 mmol/L (ref 135–145)
Total Bilirubin: 0.5 mg/dL (ref 0.0–1.2)
Total Protein: 7.6 g/dL (ref 6.5–8.1)

## 2023-03-04 LAB — URINALYSIS, W/ REFLEX TO CULTURE (INFECTION SUSPECTED)
Glucose, UA: NEGATIVE mg/dL
Hgb urine dipstick: NEGATIVE
Ketones, ur: 80 mg/dL — AB
Leukocytes,Ua: NEGATIVE
Nitrite: NEGATIVE
Protein, ur: 30 mg/dL — AB
RBC / HPF: NONE SEEN RBC/hpf (ref 0–5)
Specific Gravity, Urine: >1.03 — ABNORMAL HIGH (ref 1.005–1.030)
pH: 5.5 (ref 5.0–8.0)

## 2023-03-04 LAB — CBC
HCT: 40 % (ref 36.0–46.0)
Hemoglobin: 12.9 g/dL (ref 12.0–15.0)
MCH: 27.9 pg (ref 26.0–34.0)
MCHC: 32.3 g/dL (ref 30.0–36.0)
MCV: 86.4 fL (ref 80.0–100.0)
Platelets: 295 10*3/uL (ref 150–400)
RBC: 4.63 MIL/uL (ref 3.87–5.11)
RDW: 14.6 % (ref 11.5–15.5)
WBC: 9.6 10*3/uL (ref 4.0–10.5)
nRBC: 0 % (ref 0.0–0.2)

## 2023-03-04 LAB — BASIC METABOLIC PANEL
Anion gap: 9 (ref 5–15)
BUN: 11 mg/dL (ref 6–20)
CO2: 24 mmol/L (ref 22–32)
Calcium: 9.4 mg/dL (ref 8.9–10.3)
Chloride: 101 mmol/L (ref 98–111)
Creatinine, Ser: 0.82 mg/dL (ref 0.44–1.00)
GFR, Estimated: >60 mL/min (ref >60)
Glucose, Bld: 109 mg/dL — ABNORMAL HIGH (ref 70–99)
Potassium: 3.8 mmol/L (ref 3.5–5.1)
Sodium: 134 mmol/L — ABNORMAL LOW (ref 135–145)

## 2023-03-04 LAB — URINALYSIS, ROUTINE W REFLEX MICROSCOPIC
Bilirubin Urine: NEGATIVE
Glucose, UA: NEGATIVE mg/dL
Hgb urine dipstick: NEGATIVE
Ketones, ur: 5 mg/dL — AB
Leukocytes,Ua: NEGATIVE
Nitrite: NEGATIVE
Protein, ur: NEGATIVE mg/dL
Specific Gravity, Urine: 1.024 (ref 1.005–1.030)
pH: 5 (ref 5.0–8.0)

## 2023-03-04 LAB — POC URINE PREG, ED: Preg Test, Ur: NEGATIVE

## 2023-03-04 LAB — LIPASE, BLOOD: Lipase: 32 U/L (ref 11–51)

## 2023-03-04 MED ORDER — METOCLOPRAMIDE HCL 10 MG PO TABS: 10.0000 mg | ORAL_TABLET | Freq: Four times a day (QID) | ORAL | 0 refills | Status: AC

## 2023-03-04 MED ORDER — METOCLOPRAMIDE HCL 5 MG/ML IJ SOLN
10.0000 mg | Freq: Once | INTRAMUSCULAR | Status: AC
  Administered 2023-03-04: 10 mg via INTRAMUSCULAR

## 2023-03-04 NOTE — ED Notes (Signed)
 No answer when called several times from lobby

## 2023-03-04 NOTE — ED Triage Notes (Signed)
 Pt to ED via POV c/o vomiting and diarrhea x2 days. Hasn't been able to keep anything down. Denies any abd pain

## 2023-03-04 NOTE — ED Triage Notes (Signed)
 Pt c/o possible dehydrating   Pt states that she went to the emergency room but left after 2 hours of waiting  Pt states that she has been vomiting after drinking water.  Pt has tried zofran tablet and pepto and it comes back up after drinking water.

## 2023-03-04 NOTE — Discharge Instructions (Addendum)
 Take the Reglan  every 6 hours as needed for nausea and vomiting.  They are an oral disintegrating tablet and you can place them on her under your tongue and then will be absorbed.  Follow a clear liquid diet for the next 6 to 12 hours.  Clear liquids consist of broth, ginger ale, water, Pedialyte, and Jell-O.  After 6 to 12 hours, if you are tolerating clear liquids, you can advance to bland foods such as bananas, rice, applesauce, and toast.  If you tolerate bland foods you can continue to advance your diet as you see fit.  If you develop a fever over 100.5, increased abdominal pain, bloody vomit, or bloody stool return for reevaluation or go to the ER.

## 2023-03-04 NOTE — ED Provider Notes (Signed)
 [facility]    CSN: [id number] Arrival date & time: 03/04/23  0808      History   Chief Complaint Chief Complaint  Patient presents with   Emesis   Diarrhea    HPI Kelly Baxter is a 33 y.o. female.   HPI  33 year old female with past medical history significant for anxiety, GERD, and elevated BMI presents for evaluation of GI symptoms that began 2 days ago.  She reports that she awoke Wednesday morning with a feeling of being clammy, sweats, and began vomiting.  She states that she typically vomits in the morning and then periodically throughout the day.  She states it feels similar to being pregnant however she reports being unable to be pregnant due to having a tubal ligation.  Yesterday she had diarrhea but that is resolved.  She denies any blood in her vomit or diarrhea.  She reports that every time she tries to take food or fluids by mouth the results in emesis.  She has been using over-the-counter Zofran  without improvement of symptoms.  She does endorse dizziness but denies syncope.  Past Medical History:  Diagnosis Date   Adult BMI 40.0-44.9 kg/sq m (HCC)    Anxiety    GERD (gastroesophageal reflux disease)    Herpes    Obesity affecting pregnancy     Patient Active Problem List   Diagnosis Date Noted   Term pregnancy, repeat 12/18/2021   Labor and delivery, indication for care 11/19/2021   Obesity affecting pregnancy    Supervision of high-risk pregnancy    Adult BMI 40.0-44.9 kg/sq m (HCC) 05/20/2019   Genital herpes simplex 05/20/2019   Anxiety, generalized 02/07/2017    Past Surgical History:  Procedure Laterality Date   CESAREAN SECTION  12/14/2019   Procedure: CESAREAN SECTION;  Surgeon: Leonce Garnette JONETTA, MD;  Location: [facility] OR[facility];  Service: Obstetrics;;   CESAREAN SECTION WITH BILATERAL TUBAL LIGATION N/A 12/18/2021   Procedure: CESAREAN SECTION WITH BILATERAL TUBAL LIGATION;  Surgeon: Janit Alm Agent, MD;  Location: [facility] OR[facility];   Service: Obstetrics;  Laterality: N/A;   NO PAST SURGERIES      OB History     Gravida  2   Para  2   Term  2   Preterm      AB      Living  2      SAB      IAB      Ectopic      Multiple  0   Live Births  2            Home Medications    Prior to Admission medications   Medication Sig Start Date End Date Taking? Authorizing Provider  metoCLOPramide  (REGLAN ) 10 MG tablet Take 1 tablet (10 mg total) by mouth every 6 (six) hours. 03/04/23  Yes Bernardino Ditch, NP  omeprazole (PRILOSEC) 40 MG capsule Take by mouth. 07/06/22  Yes [provider]  Semaglutide-Weight Management 0.5 MG/0.5ML SOAJ Inject into the skin. 01/06/23 03/07/23 Yes [provider]  cetirizine (ZYRTEC) 10 MG tablet Take 10 mg by mouth daily.  06/04/19  [provider]    Family History Family History  Problem Relation Age of Onset   Heart Problems Mother    Thyroid disease Mother    Diabetes Maternal Grandmother    Diabetes Maternal Grandfather    Hypertension Maternal Grandfather    Diabetes Maternal Aunt    Diabetes Maternal Aunt     Social History  Social History   Tobacco Use   Smoking status: Never   Smokeless tobacco: Never  Vaping Use   Vaping status: Never Used  Substance Use Topics   Alcohol use: Not Currently   Drug use: No     Allergies   Aluminum, Aluminum-containing compounds, and Other   Review of Systems Review of Systems  Gastrointestinal:  Positive for diarrhea, nausea and vomiting. Negative for abdominal pain and blood in stool.  Neurological:  Positive for dizziness. Negative for syncope.     Physical Exam Triage Vital Signs ED Triage Vitals  Encounter Vitals Group     BP      Systolic BP Percentile      Diastolic BP Percentile      Pulse      Resp      Temp      Temp src      SpO2      Weight      Height      Head Circumference      Peak Flow      Pain Score      Pain Loc      Pain Education      Exclude from  Growth Chart    No data found.  Updated Vital Signs BP (!) 144/82 (BP Location: Left Arm)   Pulse 70   Temp 98.3 F (36.8 C) (Oral)   Ht 5' 4 (1.626 m)   Wt 276 lb (125.2 kg)   LMP 02/18/2023 (Approximate)   SpO2 97%   BMI 47.38 kg/m   Visual Acuity Right Eye Distance:   Left Eye Distance:   Bilateral Distance:    Right Eye Near:   Left Eye Near:    Bilateral Near:     Physical Exam Vitals and nursing note reviewed.  Constitutional:      Appearance: Normal appearance. She is not ill-appearing.  HENT:     Head: Normocephalic and atraumatic.     Mouth/Throat:     Mouth: Mucous membranes are moist.     Pharynx: Oropharynx is clear. No oropharyngeal exudate or posterior oropharyngeal erythema.  Eyes:     General: No scleral icterus.       Right eye: No discharge.        Left eye: No discharge.  Cardiovascular:     Rate and Rhythm: Normal rate and regular rhythm.     Pulses: Normal pulses.     Heart sounds: Normal heart sounds. No murmur heard.    No friction rub. No gallop.  Musculoskeletal:     Cervical back: Normal range of motion and neck supple. No tenderness.  Lymphadenopathy:     Cervical: No cervical adenopathy.  Skin:    General: Skin is warm and dry.     Capillary Refill: Capillary refill takes less than 2 seconds.     Findings: No rash.     Comments: Normal turgor  Neurological:     General: No focal deficit present.     Mental Status: She is alert and oriented to person, place, and time.      UC Treatments / Results  Labs (all labs ordered are listed, but only abnormal results are displayed) Labs Reviewed  BASIC METABOLIC PANEL - Abnormal; Notable for the following components:      Result Value   Sodium 134 (*)    Glucose, Bld 109 (*)    All other components within normal limits  URINALYSIS, W/ REFLEX TO CULTURE (INFECTION SUSPECTED) -  Abnormal; Notable for the following components:   Specific Gravity, Urine >1.030 (*)    Bilirubin Urine  SMALL (*)    Ketones, ur 80 (*)    Protein, ur 30 (*)    Bacteria, UA FEW (*)    All other components within normal limits    EKG   Radiology No results found.  Procedures Procedures (including critical care time)  Medications Ordered in UC Medications  metoCLOPramide  (REGLAN ) injection 10 mg (10 mg Intramuscular Given 03/04/23 0853)    Initial Impression / Assessment and Plan / UC Course  I have reviewed the triage vital signs and the nursing notes.  Pertinent labs & imaging results that were available during my care of the patient were reviewed by me and considered in my medical decision making (see chart for details).   Patient is a pleasant, nontoxic-appearing 33 year old female presenting for evaluation of 2 days worth of GI symptoms as outlined HPI above.  She is concerned about dehydration due to being unable to keep down fluids despite Zofran  administration.  Patient's vital signs do not reflect dehydration as her heart rate is 70 and her blood pressure is 144/82.  Physical exam reveals shiny ocular sclera and moist oral mucous membranes.  Skin turgor is also normal.  No cervical of adenopathy appreciated and cardiopulmonary exam is benign.  Patient initially went to the emergency department but left after several hours due to the long wait.  While there she had blood work obtained which showed normal electrolytes but a low CO2 and a glucose of 100.  Transaminases were unremarkable.  CBC was normal.  Urinalysis did show hazy appearance with 5 ketones but was negative for glucose, hemoglobin, or protein.  I will repeat a BMP and UA to evaluate for any electrolyte abnormalities as patient has continued to have symptoms since this blood was drawn.  I will also have staff administer 10 mg of IM Reglan  and then attempt a p.o. challenge 20 minutes after administration.  If blood work reveals any electrolyte abnormalities, or patient is unable to keep down fluids after Reglan  administration  I will refer the patient to the ER for further management.  BMP shows mild hyponatremia with a sodium of 134.  Potassium and chloride are normal, as a CO2.  Glucose is still mildly elevated at 109 but renal function is unremarkable.  Urinalysis shows high specific gravity of >1.030, small bilirubin, 80 ketones, and 30 protein.  Negative for leukocyte esterase, nitrates, hemoglobin, or glucose.  Reflex microscopy shows few bacteria with skin contamination.  Patient has been tolerating her p.o. challenge.  She has not had any vomiting following the Reglan  administration.  I will therefore discharge her home with a diagnosis of gastroenteritis with a prescription for Reglan  10 mg every 6 hours as needed for nausea and vomiting.  She should follow a clear liquid diet for the next 6 to 12 hours and then add bland foods as tolerated if she has not had any vomiting.  If she gets home and has a return of nausea and vomiting that is not controlled by the Reglan  she should go to the ER for evaluation and possible IV rehydration.   Final Clinical Impressions(s) / UC Diagnoses   Final diagnoses:  Gastroenteritis  Dehydration     Discharge Instructions      Take the Reglan  every 6 hours as needed for nausea and vomiting.  They are an oral disintegrating tablet and you can place them on her under your  tongue and then will be absorbed.  Follow a clear liquid diet for the next 6 to 12 hours.  Clear liquids consist of broth, ginger ale, water, Pedialyte, and Jell-O.  After 6 to 12 hours, if you are tolerating clear liquids, you can advance to bland foods such as bananas, rice, applesauce, and toast.  If you tolerate bland foods you can continue to advance your diet as you see fit.  If you develop a fever over 100.5, increased abdominal pain, bloody vomit, or bloody stool return for reevaluation or go to the ER.      ED Prescriptions     Medication Sig Dispense Auth. Provider   metoCLOPramide   (REGLAN ) 10 MG tablet Take 1 tablet (10 mg total) by mouth every 6 (six) hours. 30 tablet Bernardino Ditch, NP      PDMP not reviewed this encounter.   Bernardino Ditch, NP 03/04/23 1005

## 2023-03-31 ENCOUNTER — Emergency Department
Admission: EM | Admit: 2023-03-31 | Discharge: 2023-03-31 | Discharge status: Against Medical Advice | Payer: Medicaid Other

## 2023-03-31 NOTE — ED Notes (Signed)
 Called for triage x 1. No answer, pt not visualized in the lobby or outside.

## 2024-03-07 NOTE — Progress Notes (Unsigned)
 "  PCP:  Pcp, No   No chief complaint on file.    HPI:      Ms. Kelly Baxter is a 34 y.o. [phone] whose LMP was No LMP recorded., presents today for her annual examination.  Her menses are {norm/abn:715}, lasting {number: 22536} days.  Dysmenorrhea {dysmen:716}. She {does:18564} have intermenstrual bleeding.  Sex activity: {sex active: 315163}. No pain/bleeding/dryness. Last Pap: 05/18/19 Results were: no abnormalities   Hx of STDs: {STD hx:14358}  There is no FH of breast cancer. There is no FH of ovarian cancer. The patient {does:18564} do self-breast exams.  Tobacco use: {tob:20664} Alcohol use: {Alcohol:11675} No drug use.  Exercise: {exercise:31265}  She {does:18564} get adequate calcium and Vitamin D in her diet.  Patient Active Problem List   Diagnosis Date Noted   Term pregnancy, repeat 12/18/2021   Labor and delivery, indication for care 11/19/2021   Obesity affecting pregnancy    Supervision of high-risk pregnancy    Adult BMI 40.0-44.9 kg/sq m (HCC) 05/20/2019   Genital herpes simplex 05/20/2019   Anxiety, generalized 02/07/2017    Past Surgical History:  Procedure Laterality Date   CESAREAN SECTION  12/14/2019   Procedure: CESAREAN SECTION;  Surgeon: Leonce Garnette JONETTA, MD;  Location: [facility] OR[facility];  Service: Obstetrics;;   CESAREAN SECTION WITH BILATERAL TUBAL LIGATION N/A 12/18/2021   Procedure: CESAREAN SECTION WITH BILATERAL TUBAL LIGATION;  Surgeon: Janit Alm Agent, MD;  Location: [facility] OR[facility];  Service: Obstetrics;  Laterality: N/A;   NO PAST SURGERIES      Family History  Problem Relation Age of Onset   Heart Problems Mother    Thyroid disease Mother    Diabetes Maternal Grandmother    Diabetes Maternal Grandfather    Hypertension Maternal Grandfather    Diabetes Maternal Aunt    Diabetes Maternal Aunt     Social History   Socioeconomic History   Marital status: Significant Other    Spouse name: Derrick (FOB)   Number of children: 0   Years  of education: Not on file   Highest education level: Not on file  Occupational History   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  Tobacco Use   Smoking status: Never   Smokeless tobacco: Never  Vaping Use   Vaping status: Never Used  Substance and Sexual Activity   Alcohol use: Not Currently   Drug use: No   Sexual activity: Yes    Birth control/protection: Surgical    Comment: BTL  Other Topics Concern   Not on file  Social History Narrative   Not on file   Social Drivers of Health   Tobacco Use: Medium Risk (01/17/2024)   Received from [facility]   Patient History    Passive Exposure: Not on file    Smokeless Tobacco Use: Never    Smoking Tobacco Use: Former  Programmer, Applications: Not on file  Food Insecurity: No Food Insecurity (12/18/2021)   Hunger Vital Sign    Worried About Running Out of Food in the Last Year: Never true    Ran Out of Food in the Last Year: Never true  Transportation Needs: No Transportation Needs (12/18/2021)   PRAPARE - Administrator, Civil Service (Medical): No    Lack of Transportation (Non-Medical): No  Physical Activity: Not on file  Stress: Not on file  Social Connections: Not on file  Intimate Partner Violence: Not At Risk (12/18/2021)   Humiliation, Afraid, Rape, and Kick  questionnaire    Fear of Current or Ex-Partner: No    Emotionally Abused: No    Physically Abused: No    Sexually Abused: No  Depression (PHQ2-9): Not on file  Alcohol Screen: Not on file  Housing: Low Risk (12/18/2021)   Housing    Last Housing Risk Score: 0  Utilities: Not At Risk (12/18/2021)   [facility] Utilities    Threatened with loss of utilities: No  Health Literacy: Not on file    Current Medications[1]     ROS:  Review of Systems BREAST: No symptoms   Objective: There were no vitals taken for this visit.   OBGyn Exam  Results: No results found for this or any previous visit (from the past 24  hours).  Assessment/Plan: No diagnosis found.  No orders of the defined types were placed in this encounter.            GYN counsel {counseling: 16159}     F/U  No follow-ups on file.  Dream Harman B. Allayna Erlich, PA-C 03/07/2024 10:39 AM    [1]  Current Outpatient Medications:    metoCLOPramide  (REGLAN ) 10 MG tablet, Take 1 tablet (10 mg total) by mouth every 6 (six) hours., Disp: 30 tablet, Rfl: 0   omeprazole (PRILOSEC) 40 MG capsule, Take by mouth., Disp: , Rfl:   "

## 2024-03-08 ENCOUNTER — Ambulatory Visit: Admitting: Obstetrics and Gynecology
